# Patient Record
Sex: Female | Born: 1955 | Race: White | Hispanic: No | Marital: Married | Smoking: Never smoker
Health system: Southern US, Community
[De-identification: ages and names within clinical notes are randomized; demographics above are authoritative.]

## PROBLEM LIST (undated history)

## (undated) DIAGNOSIS — N189 Chronic kidney disease, unspecified: Secondary | ICD-10-CM

## (undated) DIAGNOSIS — N201 Calculus of ureter: Secondary | ICD-10-CM

## (undated) DIAGNOSIS — Z87442 Personal history of urinary calculi: Secondary | ICD-10-CM

## (undated) HISTORY — DX: Calculus of ureter: N20.1

---
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
## 1999-11-11 ENCOUNTER — Other Ambulatory Visit: Admission: RE | Admit: 1999-11-11 | Discharge: 1999-11-11 | Payer: Self-pay | Admitting: Obstetrics and Gynecology

## 2011-01-12 HISTORY — PX: COLONOSCOPY: SHX174

## 2011-06-01 ENCOUNTER — Encounter: Payer: Self-pay | Admitting: Internal Medicine

## 2011-06-01 LAB — HM MAMMOGRAPHY

## 2011-07-22 ENCOUNTER — Other Ambulatory Visit (HOSPITAL_COMMUNITY)
Admission: RE | Admit: 2011-07-22 | Discharge: 2011-07-22 | Disposition: A | Discharge status: Home / Self Care | Payer: BC Managed Care – PPO | Source: Ambulatory Visit | Attending: Family Medicine | Admitting: Family Medicine

## 2011-07-22 ENCOUNTER — Encounter: Payer: Self-pay | Admitting: Family Medicine

## 2011-07-22 ENCOUNTER — Ambulatory Visit (INDEPENDENT_AMBULATORY_CARE_PROVIDER_SITE_OTHER): Payer: BC Managed Care – PPO | Admitting: Family Medicine

## 2011-07-22 ENCOUNTER — Telehealth: Payer: Self-pay | Admitting: *Deleted

## 2011-07-22 VITALS — BP 124/78 | HR 72 | Ht 68.0 in | Wt 162.0 lb

## 2011-07-22 DIAGNOSIS — R5383 Other fatigue: Secondary | ICD-10-CM

## 2011-07-22 DIAGNOSIS — R5381 Other malaise: Secondary | ICD-10-CM

## 2011-07-22 DIAGNOSIS — Z01419 Encounter for gynecological examination (general) (routine) without abnormal findings: Secondary | ICD-10-CM | POA: Insufficient documentation

## 2011-07-22 DIAGNOSIS — Z0000 Encounter for general adult medical examination without abnormal findings: Secondary | ICD-10-CM

## 2011-07-22 DIAGNOSIS — Z13220 Encounter for screening for lipoid disorders: Secondary | ICD-10-CM

## 2011-07-22 DIAGNOSIS — Z1211 Encounter for screening for malignant neoplasm of colon: Secondary | ICD-10-CM

## 2011-07-22 DIAGNOSIS — Z23 Encounter for immunization: Secondary | ICD-10-CM

## 2011-07-22 LAB — COMPREHENSIVE METABOLIC PANEL
ALT: 12 U/L (ref 0–35)
AST: 16 U/L (ref 0–37)
Alkaline Phosphatase: 82 U/L (ref 39–117)
BUN: 15 mg/dL (ref 6–23)
Creat: 0.88 mg/dL (ref 0.50–1.10)
Total Bilirubin: 0.4 mg/dL (ref 0.3–1.2)

## 2011-07-22 LAB — CBC WITH DIFFERENTIAL/PLATELET
Basophils Absolute: 0 10*3/uL (ref 0.0–0.1)
Basophils Relative: 0 % (ref 0–1)
Eosinophils Relative: 0 % (ref 0–5)
HCT: 37.1 % (ref 36.0–46.0)
Hemoglobin: 12.5 g/dL (ref 12.0–15.0)
MCHC: 33.7 g/dL (ref 30.0–36.0)
MCV: 84.3 fL (ref 78.0–100.0)
Monocytes Absolute: 0.3 10*3/uL (ref 0.1–1.0)
Monocytes Relative: 4 % (ref 3–12)
Neutro Abs: 6.3 10*3/uL (ref 1.7–7.7)
RDW: 12.6 % (ref 11.5–15.5)

## 2011-07-22 LAB — POCT URINALYSIS DIPSTICK
Bilirubin, UA: NEGATIVE
Blood, UA: NEGATIVE
Glucose, UA: NEGATIVE
Ketones, UA: NEGATIVE
Spec Grav, UA: <=1.005
Urobilinogen, UA: NEGATIVE

## 2011-07-22 LAB — LIPID PANEL
Cholesterol: 223 mg/dL — ABNORMAL HIGH (ref 0–200)
HDL: 57 mg/dL (ref >39)
LDL Cholesterol: 151 mg/dL — ABNORMAL HIGH (ref 0–99)
Total CHOL/HDL Ratio: 3.9 Ratio
Triglycerides: 76 mg/dL (ref <150)
VLDL: 15 mg/dL (ref 0–40)

## 2011-07-22 NOTE — Telephone Encounter (Signed)
Called patient to give her consult info. She is scheduled at [facility] with their nurse for a pre-op visit 08/23/11 @ 9:15am-colonoscopy will be scheduled at this visit. Pt aware and verbalized understanding. Faxed records to [phone].

## 2011-07-22 NOTE — Progress Notes (Signed)
Tasha Lee is a 56 y.o. female who presents for a complete physical.  She has no particular concerns.  Health Maintenance: There is no immunization history on file for this patient. Believes last tetanus >10 years ago Hasn't gotten flu shots Last Pap smear: 3-4 years ago Last mammogram: 05/2011 Last colonoscopy: never Last DEXA: never Dentist: twice yearly Ophtho: every 2 years Exercise: walks 2-3 miles 3-4 times/week, plus a lot of yardwork  History reviewed. No pertinent past medical history.  History reviewed. No pertinent past surgical history.  History   Social History  . Marital Status: Married    Spouse Name: N/A    Number of Children: N/A  . Years of Education: N/A   Occupational History  . homemaker (former Runner, broadcasting/film/video at [employer])    Social History Main Topics  . Smoking status: Never Smoker   . Smokeless tobacco: Never Used  . Alcohol Use: No  . Drug Use: No  . Sexually Active: Yes -- Female partner(s)   Other Topics Concern  . Not on file   Social History Narrative   Lives with husband.  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  Former Runner, broadcasting/film/video at [employer] 1 daughter in [place], 1 daughter in [place]    Family History  Problem Relation Age of Onset  . Cancer Mother 15    breast  . Hypertension Mother   . Heart disease Father     first MI in 75's; CABG 71  . Dementia Father   . Diabetes Father   . Cancer Brother     prostate    Current outpatient prescriptions:acetaminophen (TYLENOL) 500 MG tablet, Take 500 mg by mouth as needed. Uses for HA's, but use is very rare., Disp: , Rfl:   No Known Allergies  ROS:  The patient denies anorexia, fever, weight changes, headaches,  vision changes, decreased hearing, ear pain, sore throat, breast concerns, chest pain, palpitations, dizziness, syncope, dyspnea on exertion, cough, swelling, nausea, vomiting, diarrhea, constipation, abdominal pain, melena, hematochezia, indigestion/heartburn, hematuria, incontinence,  dysuria, vaginal bleeding, discharge, odor or itch, genital lesions, joint pains, numbness, tingling, weakness, tremor, suspicious skin lesions, depression, anxiety, abnormal bleeding/bruising, or enlarged lymph nodes.  PHYSICAL EXAM: BP 124/78  Pulse 72  Ht 5\' 8"  (1.727 m)  Wt 162 lb (73.483 kg)  BMI 24.63 kg/m2  General Appearance:    Alert, cooperative, no distress, appears stated age  Head:    Normocephalic, without obvious abnormality, atraumatic  Eyes:    PERRL, conjunctiva/corneas clear, EOM's intact, fundi    benign  Ears:    Normal TM's and external ear canals  Nose:   Nares normal, mucosa normal, no drainage or sinus   tenderness  Throat:   Lips, mucosa, and tongue normal; teeth and gums normal  Neck:   Supple, no lymphadenopathy;  thyroid:  no   enlargement/tenderness/nodules; no carotid   bruit or JVD  Back:    Spine nontender, no curvature, ROM normal, no CVA     tenderness  Lungs:     Clear to auscultation bilaterally without wheezes, rales or     ronchi; respirations unlabored  Chest Wall:    No tenderness or deformity   Heart:    Regular rate and rhythm, S1 and S2 normal, no murmur, rub   or gallop  Breast Exam:    No tenderness, masses, or nipple discharge or inversion.      No axillary lymphadenopathy  Abdomen:     Soft, non-tender, nondistended, normoactive bowel sounds,  no masses, no hepatosplenomegaly  Genitalia:    Normal external genitalia without lesions.  BUS and vagina normal; cervix without lesions, or cervical motion tenderness. No abnormal vaginal discharge.  Uterus and adnexa not enlarged, nontender, no masses.  Pap performed  Rectal:    Normal tone, no masses or tenderness; guaiac negative stool  Extremities:   No clubbing, cyanosis or edema  Pulses:   2+ and symmetric all extremities  Skin:   Skin color, texture, turgor normal, no rashes or lesions. 0.5 cm mole in upper mid back, somewhat darker in central area.  Lymph nodes:   Cervical,  supraclavicular, and axillary nodes normal  Neurologic:   CNII-XII intact, normal strength, sensation and gait; reflexes 2+ and symmetric throughout          Psych:   Normal mood, affect, hygiene and grooming.     ASSESSMENT/PLAN:  1. Routine general medical examination at a health care facility  POCT Urinalysis Dipstick, Visual acuity screening, Lipid panel, Cytology - PAP  2. Need for Tdap vaccination  Tdap vaccine greater than or equal to 7yo IM  3. Other malaise and fatigue  Comprehensive metabolic panel, CBC with Differential, Vitamin D 25 hydroxy, TSH  4. Screening for lipoid disorders  Lipid panel  5. Screen for colon cancer  Ambulatory referral to Gastroenterology   Mole on back--slightly atypical.  Reviewed ABCD's of melanoma, to ask husband if any change.  To take picture and recheck in 3 and 6 months.  If any change, return for excision  Discussed monthly self breast exams and yearly mammograms after the age of 68; at least 30 minutes of aerobic activity at least 5 days/week; proper sunscreen use reviewed; healthy diet, including goals of calcium and vitamin D intake and alcohol recommendations (less than or equal to 1 drink/day) reviewed; regular seatbelt use; changing batteries in smoke detectors.  Immunization recommendations discussed--Tdap given today.  Yearly flu shots encouraged.  Colonoscopy recommendations reviewed--past due.  Discussed reasons for screening, and she is agreeable.  Refer to [facility] bc that's where her husband goes.

## 2011-07-22 NOTE — Patient Instructions (Signed)

## 2011-07-23 LAB — TSH: TSH: 1.636 u[IU]/mL (ref 0.350–4.500)

## 2011-07-27 ENCOUNTER — Encounter: Payer: Self-pay | Admitting: Family Medicine

## 2011-07-30 ENCOUNTER — Encounter: Payer: Self-pay | Admitting: Family Medicine

## 2011-07-30 ENCOUNTER — Other Ambulatory Visit (INDEPENDENT_AMBULATORY_CARE_PROVIDER_SITE_OTHER): Payer: BC Managed Care – PPO

## 2011-07-30 DIAGNOSIS — Z0000 Encounter for general adult medical examination without abnormal findings: Secondary | ICD-10-CM

## 2011-07-30 LAB — POC HEMOCCULT BLD/STL (HOME/3-CARD/SCREEN): Card #3 Fecal Occult Blood, POC: NEGATIVE

## 2011-10-04 ENCOUNTER — Other Ambulatory Visit: Payer: Self-pay | Admitting: Gastroenterology

## 2011-10-04 ENCOUNTER — Other Ambulatory Visit (HOSPITAL_COMMUNITY): Payer: Self-pay | Admitting: Gastroenterology

## 2011-10-04 ENCOUNTER — Ambulatory Visit (HOSPITAL_COMMUNITY)
Admission: RE | Admit: 2011-10-04 | Discharge: 2011-10-04 | Disposition: A | Discharge status: Home / Self Care | Payer: BC Managed Care – PPO | Source: Ambulatory Visit | Attending: Gastroenterology | Admitting: Gastroenterology

## 2011-10-04 DIAGNOSIS — R933 Abnormal findings on diagnostic imaging of other parts of digestive tract: Secondary | ICD-10-CM

## 2011-10-04 DIAGNOSIS — Q438 Other specified congenital malformations of intestine: Secondary | ICD-10-CM | POA: Insufficient documentation

## 2011-10-04 LAB — HM COLONOSCOPY

## 2011-10-08 ENCOUNTER — Encounter: Payer: Self-pay | Admitting: Family Medicine

## 2012-08-10 LAB — HM MAMMOGRAPHY: HM Mammogram: NEGATIVE

## 2012-08-11 ENCOUNTER — Encounter: Payer: Self-pay | Admitting: Internal Medicine

## 2013-08-13 LAB — HM MAMMOGRAPHY

## 2013-08-14 ENCOUNTER — Encounter: Payer: Self-pay | Admitting: Internal Medicine

## 2013-11-12 ENCOUNTER — Encounter: Payer: Self-pay | Admitting: Family Medicine

## 2014-08-16 LAB — HM MAMMOGRAPHY: HM Mammogram: NEGATIVE

## 2014-08-22 ENCOUNTER — Encounter: Payer: Self-pay | Admitting: *Deleted

## 2015-09-17 LAB — HM MAMMOGRAPHY

## 2015-09-24 ENCOUNTER — Encounter: Payer: Self-pay | Admitting: Family Medicine

## 2015-10-12 DIAGNOSIS — N189 Chronic kidney disease, unspecified: Secondary | ICD-10-CM

## 2015-10-12 DIAGNOSIS — N201 Calculus of ureter: Secondary | ICD-10-CM

## 2015-10-12 HISTORY — DX: Calculus of ureter: N20.1

## 2015-10-12 HISTORY — DX: Chronic kidney disease, unspecified: N18.9

## 2015-10-26 ENCOUNTER — Emergency Department (HOSPITAL_BASED_OUTPATIENT_CLINIC_OR_DEPARTMENT_OTHER): Payer: BLUE CROSS/BLUE SHIELD

## 2015-10-26 ENCOUNTER — Encounter: Payer: Self-pay | Admitting: Family Medicine

## 2015-10-26 ENCOUNTER — Encounter (HOSPITAL_BASED_OUTPATIENT_CLINIC_OR_DEPARTMENT_OTHER): Payer: Self-pay | Admitting: *Deleted

## 2015-10-26 ENCOUNTER — Emergency Department (HOSPITAL_BASED_OUTPATIENT_CLINIC_OR_DEPARTMENT_OTHER)
Admission: EM | Admit: 2015-10-26 | Discharge: 2015-10-26 | Disposition: A | Discharge status: Home / Self Care | Payer: BLUE CROSS/BLUE SHIELD | Attending: Emergency Medicine | Admitting: Emergency Medicine

## 2015-10-26 DIAGNOSIS — M549 Dorsalgia, unspecified: Secondary | ICD-10-CM | POA: Diagnosis present

## 2015-10-26 DIAGNOSIS — R109 Unspecified abdominal pain: Secondary | ICD-10-CM | POA: Diagnosis not present

## 2015-10-26 DIAGNOSIS — N201 Calculus of ureter: Secondary | ICD-10-CM | POA: Insufficient documentation

## 2015-10-26 DIAGNOSIS — N3001 Acute cystitis with hematuria: Secondary | ICD-10-CM | POA: Insufficient documentation

## 2015-10-26 DIAGNOSIS — N200 Calculus of kidney: Secondary | ICD-10-CM | POA: Insufficient documentation

## 2015-10-26 LAB — COMPREHENSIVE METABOLIC PANEL
ALT: 16 U/L (ref 14–54)
AST: 21 U/L (ref 15–41)
Albumin: 4.3 g/dL (ref 3.5–5.0)
Alkaline Phosphatase: 68 U/L (ref 38–126)
Anion gap: 10 (ref 5–15)
BUN: 23 mg/dL — ABNORMAL HIGH (ref 6–20)
CO2: 25 mmol/L (ref 22–32)
Calcium: 9.7 mg/dL (ref 8.9–10.3)
Chloride: 103 mmol/L (ref 101–111)
Creatinine, Ser: 0.99 mg/dL (ref 0.44–1.00)
GFR calc Af Amer: >60 mL/min (ref >60)
GFR, EST NON AFRICAN AMERICAN: 60 mL/min — AB (ref >60)
Glucose, Bld: 126 mg/dL — ABNORMAL HIGH (ref 65–99)
POTASSIUM: 3.8 mmol/L (ref 3.5–5.1)
SODIUM: 138 mmol/L (ref 135–145)
Total Bilirubin: 0.5 mg/dL (ref 0.3–1.2)
Total Protein: 7.9 g/dL (ref 6.5–8.1)

## 2015-10-26 LAB — URINALYSIS, ROUTINE W REFLEX MICROSCOPIC
Glucose, UA: NEGATIVE mg/dL
KETONES UR: 15 mg/dL — AB
Nitrite: NEGATIVE
PROTEIN: 30 mg/dL — AB
Specific Gravity, Urine: 1.027 (ref 1.005–1.030)
pH: 5.5 (ref 5.0–8.0)

## 2015-10-26 LAB — CBC WITH DIFFERENTIAL/PLATELET
BASOS ABS: 0 10*3/uL (ref 0.0–0.1)
BASOS PCT: 0 %
EOS ABS: 0.1 10*3/uL (ref 0.0–0.7)
EOS PCT: 1 %
HCT: 39.7 % (ref 36.0–46.0)
Hemoglobin: 13.7 g/dL (ref 12.0–15.0)
LYMPHS PCT: 11 %
Lymphs Abs: 1.3 10*3/uL (ref 0.7–4.0)
MCH: 30.1 pg (ref 26.0–34.0)
MCHC: 34.5 g/dL (ref 30.0–36.0)
MCV: 87.3 fL (ref 78.0–100.0)
Monocytes Absolute: 0.4 10*3/uL (ref 0.1–1.0)
Monocytes Relative: 4 %
Neutro Abs: 10.1 10*3/uL — ABNORMAL HIGH (ref 1.7–7.7)
Neutrophils Relative %: 84 %
PLATELETS: 229 10*3/uL (ref 150–400)
RBC: 4.55 MIL/uL (ref 3.87–5.11)
RDW: 12.3 % (ref 11.5–15.5)
WBC: 11.9 10*3/uL — AB (ref 4.0–10.5)

## 2015-10-26 LAB — URINE MICROSCOPIC-ADD ON

## 2015-10-26 MED ORDER — HYDROCODONE-ACETAMINOPHEN 5-325 MG PO TABS: 1.0000 | ORAL_TABLET | Freq: Four times a day (QID) | ORAL | 0 refills | Status: DC | PRN

## 2015-10-26 MED ORDER — ONDANSETRON 4 MG PO TBDP: 4.0000 mg | ORAL_TABLET | Freq: Three times a day (TID) | ORAL | 1 refills | Status: DC | PRN

## 2015-10-26 MED ORDER — HYDROMORPHONE HCL 1 MG/ML IJ SOLN
1.0000 mg | Freq: Once | INTRAMUSCULAR | Status: AC
  Administered 2015-10-26: 1 mg via INTRAVENOUS
  Filled 2015-10-26: qty 1

## 2015-10-26 MED ORDER — ONDANSETRON HCL 4 MG/2ML IJ SOLN
4.0000 mg | Freq: Once | INTRAMUSCULAR | Status: AC
  Administered 2015-10-26: 4 mg via INTRAVENOUS
  Filled 2015-10-26: qty 2

## 2015-10-26 MED ORDER — SODIUM CHLORIDE 0.9 % IV SOLN
INTRAVENOUS | Status: DC
  Administered 2015-10-26: 12:00:00 via INTRAVENOUS

## 2015-10-26 MED ORDER — SODIUM CHLORIDE 0.9 % IV BOLUS (SEPSIS)
500.0000 mL | Freq: Once | INTRAVENOUS | Status: AC
  Administered 2015-10-26: 500 mL via INTRAVENOUS

## 2015-10-26 MED ORDER — DEXTROSE 5 % IV SOLN
1.0000 g | Freq: Once | INTRAVENOUS | Status: AC
  Administered 2015-10-26: 1 g via INTRAVENOUS
  Filled 2015-10-26: qty 10

## 2015-10-26 MED ORDER — CEPHALEXIN 500 MG PO CAPS: 500.0000 mg | ORAL_CAPSULE | Freq: Four times a day (QID) | ORAL | 0 refills | Status: DC

## 2015-10-26 NOTE — ED Triage Notes (Signed)
Pt reports mid back pain radiating around her left side. Also reports vomiting. Denies hx of kidney stones. Reports that she was treated for UTI with cipro but only took 3 days of it.

## 2015-10-26 NOTE — Discharge Instructions (Addendum)
Take antibiotic Keflex as directed for the next 7 days. Urine culture sent. Make an appointment to follow-up with [facility] this week. Call on Monday. Take the hydrocodone as needed for pain. Supplement with the Zofran as needed for nausea and vomiting. Return for any new or worse symptoms.

## 2015-10-26 NOTE — ED Provider Notes (Signed)
[facility] Provider Note   CSN: [id number] Arrival date & time: 10/26/15  1100     History   Chief Complaint Chief Complaint  Patient presents with  . Back Pain    HPI Tasha Lee is a 60 y.o. female.  Patient with acute onset of left CVA left flank pain at 7:30 this morning. No prior history of kidney stones. Pain is intensified. Now moving over towards the right part of the back. Associated with nausea and vomiting. Pain is 10 out of 10. No gross blood in the urine. Patient treated with urinary tract infection a week ago 3 days worth of Cipro seemed to get better.      History reviewed. No pertinent past medical history.  There are no active problems to display for this patient.   History reviewed. No pertinent surgical history.  OB History    Gravida Para Term Preterm AB Living   1 1       2    SAB TAB Ectopic Multiple Live Births                   Home Medications    Prior to Admission medications   Medication Sig Start Date End Date Taking? Authorizing Provider  acetaminophen (TYLENOL) 500 MG tablet Take 500 mg by mouth as needed. Uses for HA's, but use is very rare.    Historical Provider, MD  cephALEXin (KEFLEX) 500 MG capsule Take 1 capsule (500 mg total) by mouth 4 (four) times daily. 10/26/15   Vanetta Mulders, MD  HYDROcodone-acetaminophen (NORCO/VICODIN) 5-325 MG tablet Take 1-2 tablets by mouth every 6 (six) hours as needed for moderate pain. 10/26/15   Vanetta Mulders, MD  ondansetron (ZOFRAN ODT) 4 MG disintegrating tablet Take 1 tablet (4 mg total) by mouth every 8 (eight) hours as needed for nausea or vomiting. 10/26/15   Vanetta Mulders, MD    Family History Family History  Problem Relation Age of Onset  . Cancer Mother 62    breast  . Hypertension Mother   . Heart disease Father     first MI in 79's; CABG 55  . Dementia Father   . Diabetes Father   . Cancer Brother     prostate    Social History Social History    Substance Use Topics  . Smoking status: Never Smoker  . Smokeless tobacco: Never Used  . Alcohol use No     Allergies   Review of patient's allergies indicates no known allergies.   Review of Systems Review of Systems  Constitutional: Negative for fatigue and fever.  HENT: Negative for congestion.   Eyes: Negative for visual disturbance.  Respiratory: Negative for shortness of breath.   Cardiovascular: Negative for chest pain.  Gastrointestinal: Positive for nausea and vomiting. Negative for abdominal pain.  Genitourinary: Positive for flank pain. Negative for dysuria and frequency.  Musculoskeletal: Positive for back pain.  Skin: Negative for rash.  Neurological: Negative for headaches.  Hematological: Does not bruise/bleed easily.  Psychiatric/Behavioral: Negative for confusion.     Physical Exam Updated Vital Signs BP 120/63 (BP Location: Left Arm)   Pulse (!) 54   Temp 97.8 F (36.6 C) (Oral)   Resp 18   Ht 5\' 10"  (1.778 m)   Wt 72.6 kg   SpO2 99%   BMI 22.96 kg/m   Physical Exam  Constitutional: She is oriented to person, place, and time. She appears well-developed and well-nourished. She appears distressed.  HENT:  Head: Normocephalic  and atraumatic.  Mouth/Throat: Oropharynx is clear and moist.  Eyes: EOM are normal. Pupils are equal, round, and reactive to light.  Neck: Normal range of motion. Neck supple.  Cardiovascular: Normal rate, regular rhythm and normal heart sounds.   Pulmonary/Chest: Effort normal and breath sounds normal. No respiratory distress.  Abdominal: Soft. Bowel sounds are normal. There is no tenderness.  Musculoskeletal: Normal range of motion. She exhibits no edema.  Neurological: She is alert and oriented to person, place, and time. No cranial nerve deficit. She exhibits normal muscle tone. Coordination normal.  Skin: Skin is warm.  Nursing note and vitals reviewed.    ED Treatments / Results  Labs (all labs ordered are  listed, but only abnormal results are displayed) Labs Reviewed  URINALYSIS, ROUTINE W REFLEX MICROSCOPIC (NOT AT [facility]) - Abnormal; Notable for the following:       Result Value   Color, Urine AMBER (*)    APPearance CLOUDY (*)    Hgb urine dipstick LARGE (*)    Bilirubin Urine SMALL (*)    Ketones, ur 15 (*)    Protein, ur 30 (*)    Leukocytes, UA SMALL (*)    All other components within normal limits  CBC WITH DIFFERENTIAL/PLATELET - Abnormal; Notable for the following:    WBC 11.9 (*)    Neutro Abs 10.1 (*)    All other components within normal limits  COMPREHENSIVE METABOLIC PANEL - Abnormal; Notable for the following:    Glucose, Bld 126 (*)    BUN 23 (*)    GFR calc non Af Amer 60 (*)    All other components within normal limits  URINE MICROSCOPIC-ADD ON - Abnormal; Notable for the following:    Squamous Epithelial / LPF 0-5 (*)    Bacteria, UA MANY (*)    All other components within normal limits  URINE CULTURE    EKG  EKG Interpretation None       Radiology Ct Renal Stone Study  Result Date: 10/26/2015 CLINICAL DATA:  Left flank pain.  Hematuria. EXAM: CT ABDOMEN AND PELVIS WITHOUT CONTRAST TECHNIQUE: Multidetector CT imaging of the abdomen and pelvis was performed following the standard protocol without IV contrast. COMPARISON:  None. FINDINGS: Lower chest:  Unremarkable. Hepatobiliary: No focal abnormality in the liver on this study without intravenous contrast. Layering sludge in the gallbladder lumen. No intrahepatic or extrahepatic biliary dilation. Pancreas: No focal mass lesion. No dilatation of the main duct. No intraparenchymal cyst. No peripancreatic edema. Spleen: No splenomegaly. No focal mass lesion. Adrenals/Urinary Tract: No adrenal nodule or mass. 8 mm water density lesion interpolar right kidney is compatible with a cyst. 4 mm stone identified lower pole left kidney. There is mild to moderate left hydronephrosis. 4 x 4 x 6 mm left UPJ stone noted. No  ureteral stones. No bladder stones. Stomach/Bowel: Stomach is nondistended. No gastric wall thickening. No evidence of outlet obstruction. Duodenum is normally positioned as is the ligament of Treitz. No small bowel wall thickening. No small bowel dilatation. The terminal ileum is normal. The appendix is normal. No gross colonic mass. No colonic wall thickening. No substantial diverticular change. Vascular/Lymphatic: No abdominal aortic aneurysm. No abdominal aortic atherosclerotic calcification. There is no gastrohepatic or hepatoduodenal ligament lymphadenopathy. No intraperitoneal or retroperitoneal lymphadenopathy. No pelvic sidewall lymphadenopathy. Reproductive: The uterus has normal CT imaging appearance. Left ovary unremarkable. 18 mm right ovarian cystic focus compatible with benign etiology. Other: No intraperitoneal free fluid. Musculoskeletal: Bone windows reveal no worrisome lytic  or sclerotic osseous lesions. IMPRESSION: 4 x 4 x 6 mm left UPJ stone causes mild to moderate left hydronephrosis. Left nephrolithiasis. Electronically Signed   By: Kennith CenterEric  Mansell M.D.   On: 10/26/2015 12:29    Procedures Procedures (including critical care time)  Medications Ordered in ED Medications  0.9 %  sodium chloride infusion ( Intravenous New Bag/Given 10/26/15 1221)  sodium chloride 0.9 % bolus 500 mL (0 mLs Intravenous Stopped 10/26/15 1314)  ondansetron (ZOFRAN) injection 4 mg (4 mg Intravenous Given 10/26/15 1220)  HYDROmorphone (DILAUDID) injection 1 mg (1 mg Intravenous Given 10/26/15 1220)  cefTRIAXone (ROCEPHIN) 1 g in dextrose 5 % 50 mL IVPB (1 g Intravenous New Bag/Given 10/26/15 1311)     Initial Impression / Assessment and Plan / ED Course  I have reviewed the triage vital signs and the nursing notes.  Pertinent labs & imaging results that were available during my care of the patient were reviewed by me and considered in my medical decision making (see chart for details).  Clinical  Course    Patient with acute onset of left back pain left flank pain. Started radiate over to the right side. Onset of the pain was at 7:30 this morning. Got significantly worse. Associated with nausea and vomiting. Patient treated for urinary tract infection a week ago to 3 day course of Cipro.  Workup here today consistent with ureteral stone. 4 x 6 mm stone on the left side high up. Patient will follow-up with [facility]. Will treat with pain medication and antinausea medicine. Urinalysis also with some fair amount of bacteria so culture sent and will restart antibiotics this time we'll go with Keflex patient received 1 dose of Rocephin here.   Final Clinical Impressions(s) / ED Diagnoses   Final diagnoses:  Acute cystitis with hematuria  Left ureteral stone    New Prescriptions New Prescriptions   CEPHALEXIN (KEFLEX) 500 MG CAPSULE    Take 1 capsule (500 mg total) by mouth 4 (four) times daily.   HYDROCODONE-ACETAMINOPHEN (NORCO/VICODIN) 5-325 MG TABLET    Take 1-2 tablets by mouth every 6 (six) hours as needed for moderate pain.   ONDANSETRON (ZOFRAN ODT) 4 MG DISINTEGRATING TABLET    Take 1 tablet (4 mg total) by mouth every 8 (eight) hours as needed for nausea or vomiting.     Vanetta MuldersScott Trini Soldo, MD 10/26/15 1408

## 2015-10-26 NOTE — ED Notes (Signed)
Pt woke this morning about 0730 with pain in left flank and nausea.  Pt vomited x5-6 this morning, last occurrence approx 90 minutes ago.

## 2015-10-26 NOTE — ED Notes (Signed)
Pt states she took 500mg  tylenol at approx 0900 today but vomited very shortly thereafter.

## 2015-10-28 LAB — URINE CULTURE: Culture: <10000 — AB

## 2015-11-07 ENCOUNTER — Other Ambulatory Visit: Payer: Self-pay | Admitting: Urology

## 2015-11-10 ENCOUNTER — Encounter (HOSPITAL_COMMUNITY): Payer: Self-pay | Admitting: *Deleted

## 2015-11-10 DIAGNOSIS — N201 Calculus of ureter: Secondary | ICD-10-CM | POA: Diagnosis not present

## 2015-11-12 HISTORY — PX: LITHOTRIPSY: SUR834

## 2015-11-12 NOTE — H&P (Signed)
Office Visit Report     11/06/2015   --------------------------------------------------------------------------------   Tasha Lee  MRN: [id number]  PRIMARY CARE:  Lavonda JumboEve A. Knapp, MD  DOB: 05-08-1955, 60 year old Female  REFERRING:  Vanetta MuldersScott Zackowski  SSN: [id number]  PROVIDER:  Jerilee FieldMatthew Eskridge, M.D.    TREATING:  Anne FuLarry Gibson    LOCATION:  [facility] [phone]   --------------------------------------------------------------------------------   CC/HPI: Two-week office visit follow-up for a left ureteral calculi.    She has not had any bothersome symptoms in approximately 3 days but before that flank pain and lower back pain were severe and intermittent. Denies bothersome lower urinary tract symptoms. She cannot tolerate Tamsulosin due to hypotension. Denies fevers.     ALLERGIES: No Known Drug Allergies    MEDICATIONS: None   GU PSH: None   NON-GU PSH: None   GU PMH: Calculus Ureter - 10/30/2015    NON-GU PMH: None   FAMILY HISTORY: 2 daughters - Other Coronary Artery Disease - Mother, Father Dementia - Father Kidney Stones - Father   SOCIAL HISTORY: Marital Status: Married Current Smoking Status: Patient has never smoked.  Does not drink caffeine. 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    REVIEW OF SYSTEMS:    GU Review Female:   Patient denies frequent urination, hard to postpone urination, burning /pain with urination, get up at night to urinate, leakage of urine, stream starts and stops, trouble starting your stream, have to strain to urinate, and currently pregnant.  Gastrointestinal (Upper):   Patient denies nausea, vomiting, and indigestion/ heartburn.  Gastrointestinal (Lower):   Patient denies diarrhea and constipation.  Constitutional:   Patient denies fatigue, weight loss, fever, and night sweats.  Skin:   Patient denies skin rash/ lesion and itching.  Eyes:   Patient denies blurred vision and double  vision.  Ears/ Nose/ Throat:   Patient denies sore throat and sinus problems.  Hematologic/Lymphatic:   Patient denies swollen glands and easy bruising.  Cardiovascular:   Patient denies leg swelling and chest pains.  Respiratory:   Patient denies cough and shortness of breath.  Endocrine:   Patient denies excessive thirst.  Musculoskeletal:   Patient denies back pain and joint pain.  Neurological:   Patient denies headaches and dizziness.  Psychologic:   Patient denies depression and anxiety.   VITAL SIGNS:      11/06/2015 08:24 AM  Weight 155 lb / 70.31 kg  Height 70 in / 177.8 cm  BP 137/86 mmHg  Pulse 71 /min  Temperature 98.2 F / 37 C  BMI 22.2 kg/m   GU PHYSICAL EXAMINATION:    Bladder: Normal to palpation, no tenderness, no mass, normal size.    MULTI-SYSTEM PHYSICAL EXAMINATION:    Constitutional: Well-nourished. No physical deformities. Normally developed. Good grooming.  Neck: Neck symmetrical, not swollen. Normal tracheal position.  Respiratory: No labored breathing, no use of accessory muscles.   Cardiovascular: Normal temperature, normal extremity pulses, no swelling, no varicosities.  Lymphatic: No enlargement of neck, axillae, groin.  Skin: No paleness, no jaundice, no cyanosis. No lesion, no ulcer, no rash.  Neurologic / Psychiatric: Oriented to time, oriented to place, oriented to person. No depression, no anxiety, no agitation.  Gastrointestinal: No hernia. No mass, no tenderness, no rigidity, non obese abdomen.   Eyes: Normal conjunctivae. Normal eyelids.  Ears, Nose, Mouth, and Throat: Left ear no scars, no lesions, no masses. Right ear no scars, no lesions, no masses. Nose no  scars, no lesions, no masses. Normal hearing. Normal lips.  Musculoskeletal: Spine, ribs, pelvis no bilateral tenderness. Normal gait and station of head and neck.     PAST DATA REVIEWED:  Source Of History:  Patient  Records Review:   Previous Patient Records  Urine Test Review:    Urinalysis  X-Ray Review: KUB: Reviewed Films.     PROCEDURES:         KUB - 74000  A single view of the abdomen is obtained.      A calculus is noted over the transverse process of the left L3 lumbar vertebrae. No other ureteral calculi are seen. Bladder is free of obstruction. Normal appearing bowel gas pattern.         Urinalysis Dipstick Dipstick Cont'd  Color: Yellow Bilirubin: Neg  Appearance: Clear Ketones: Neg  Specific Gravity: 1.025 Blood: Neg  pH: <=5.0 Protein: Neg  Glucose: Neg Urobilinogen: 0.2    Nitrites: Neg    Leukocyte Esterase: Neg    ASSESSMENT:      ICD-10 Details  1 GU:   Calculus Ureter - N20.1    PLAN:            Medications Stop Meds: Keflex 500 mg capsule  Discontinue: 11/06/2015  - Reason: The medication cycle was completed.  Tamsulosin Hcl 0.4 mg capsule, ext release 24 hr 1 capsule PO Q HS  Start: 10/30/2015  Stop: 12/29/2015   Discontinue: 11/06/2015  - Reason: The medication cycle was completed.            Orders Labs Urine Culture and Sensitivity          Schedule Return Visit: Next Available Appointment - Schedule Surgery          Document Letter(s):  Created for Patient: Clinical Summary         Notes:   Will discuss setting her up for possible ESWL. Risks and benefits described to her. She has a grandchild due in 10 days as well a parent in hospice so she is hoping to expedite. I answered all questions. Understanding expressed. F/u instructions for worsening symptoms given. We will contact her with next plan of care.    * Signed by Anne FuLarry Gibson on 11/06/15 at 8:52 AM (EDT)*

## 2015-11-13 ENCOUNTER — Ambulatory Visit (HOSPITAL_COMMUNITY): Payer: BLUE CROSS/BLUE SHIELD

## 2015-11-13 ENCOUNTER — Encounter (HOSPITAL_COMMUNITY)
Admission: RE | Disposition: A | Discharge status: Home / Self Care | Payer: Self-pay | Source: Ambulatory Visit | Attending: Urology

## 2015-11-13 ENCOUNTER — Encounter (HOSPITAL_COMMUNITY): Payer: Self-pay | Admitting: General Practice

## 2015-11-13 ENCOUNTER — Ambulatory Visit (HOSPITAL_COMMUNITY)
Admission: RE | Admit: 2015-11-13 | Discharge: 2015-11-13 | Disposition: A | Discharge status: Home / Self Care | Payer: BLUE CROSS/BLUE SHIELD | Source: Ambulatory Visit | Attending: Urology | Admitting: Urology

## 2015-11-13 DIAGNOSIS — N201 Calculus of ureter: Secondary | ICD-10-CM

## 2015-11-13 HISTORY — DX: Chronic kidney disease, unspecified: N18.9

## 2015-11-13 SURGERY — LITHOTRIPSY, ESWL
Anesthesia: LOCAL | Laterality: Left

## 2015-11-13 MED ORDER — SODIUM CHLORIDE 0.9 % IV SOLN
INTRAVENOUS | Status: DC
  Administered 2015-11-13: 07:00:00 via INTRAVENOUS

## 2015-11-13 MED ORDER — DIPHENHYDRAMINE HCL 25 MG PO CAPS
25.0000 mg | ORAL_CAPSULE | ORAL | Status: AC
  Administered 2015-11-13: 25 mg via ORAL
  Filled 2015-11-13: qty 1

## 2015-11-13 MED ORDER — CIPROFLOXACIN HCL 500 MG PO TABS
500.0000 mg | ORAL_TABLET | ORAL | Status: AC
  Administered 2015-11-13: 500 mg via ORAL
  Filled 2015-11-13: qty 1

## 2015-11-13 MED ORDER — DIAZEPAM 5 MG PO TABS
10.0000 mg | ORAL_TABLET | ORAL | Status: AC
  Administered 2015-11-13: 10 mg via ORAL
  Filled 2015-11-13: qty 2

## 2015-11-13 NOTE — Interval H&P Note (Signed)
History and Physical Interval Note:  11/13/2015 7:38 AM  Tasha Lee  has presented today for surgery, with the diagnosis of LEFT URETERAL STONE  The various methods of treatment have been discussed with the patient and family. After consideration of risks, benefits and other options for treatment, the patient has consented to  Procedure(s): LEFT EXTRACORPOREAL SHOCK WAVE LITHOTRIPSY (ESWL) (Left) as a surgical intervention .  The patient's history has been reviewed, patient examined, no change in status, stable for surgery.  I have reviewed the patient's chart and labs.  Questions were answered to the patient's satisfaction.     Kemper Heupel,LES

## 2015-11-13 NOTE — Discharge Instructions (Signed)
1. You should strain your urine and collect all fragments and bring them to your follow up appointment.  °2. You should take your pain medication as needed.  Please call if your pain is severe to the point that it is not controlled with your pain medication. °3. You should call if you develop fever > 101 or persistent nausea or vomiting. °4. Your doctor may prescribe tamsulosin to take to help facilitate stone passage. °

## 2015-11-13 NOTE — Op Note (Signed)
Please see scanned chart for ESWL operative note. 

## 2016-03-18 DIAGNOSIS — N200 Calculus of kidney: Secondary | ICD-10-CM | POA: Diagnosis not present

## 2016-04-25 ENCOUNTER — Encounter: Payer: Self-pay | Admitting: Family Medicine

## 2016-04-25 NOTE — Progress Notes (Signed)
Chief Complaint  Patient presents with  . Annual Exam    fasting annual with pap. Will have eyes checked at opthomologist. No concerns.     Tasha Lee is a 61 y.o. female who presents for a complete physical.  She was last seen in this office in 2013.  Her brother was diagnosed with leukemia 2 months ago.  She reports he is currently "in remission", but extremely weak. 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  Her mother passed away in 12/11/22.  She is still helping take care of her in-laws, which is sometimes very frustrating.  L ureteral stone in 10/2015. Seen by Dr. Junious Silk at [facility] to f/u ER visit. She underwent lithotripsy with Dr. Alinda Money in 11-Dec-2022. Stone eventually passed (didn't catch it, but reports it showed up on x-rays/calcified).   Immunization History  Administered Date(s) Administered  . Tdap 07/22/2011   Flu shot--never had one Last Pap smear: 07/2011, normal (HPV not tested) Last mammogram: 09/2015 Last colonoscopy: 09/2011 normal; very tortuous colon, needed to complete with Barium Enema; Dr. Penelope Coop at [place]. Told 10 year follow-up Last DEXA: never Dentist: twice yearly Ophtho: yearly, plans to switch doctors Exercise: walks 35 minutes,  2 times/week, plus a lot of yardwork (mows 2 yards/week, with push-mower). Cares for grandson (20#), in-laws, cleaning out mother's home. Lipids: Lab Results  Component Value Date   CHOL 223 (H) 07/22/2011   HDL 57 07/22/2011   LDLCALC 151 (H) 07/22/2011   TRIG 76 07/22/2011   CHOLHDL 3.9 07/22/2011   Vitamin D-OH 47 in 07/2011  Past Medical History:  Diagnosis Date  . Arthritis   . Chronic kidney disease 10/2015  . Left ureteral stone 10/2015    Past Surgical History:  Procedure Laterality Date  . COLONOSCOPY  2013   Tortuous colon, finished with barium enema  . LITHOTRIPSY Left 12-11-2015    Social History   Social History  . Marital status: Married    Spouse name: N/A  .  Number of children: N/A  . Years of education: N/A   Occupational History  . homemaker (former Pharmacist, hospital at [employer])    Social History Main Topics  . Smoking status: Never Smoker  . Smokeless tobacco: Never Used  . Alcohol use No  . Drug use: No  . Sexual activity: Yes    Partners: Male   Other Topics Concern  . Not on file   Social History Narrative   Lives with husband.  Caregiver for mother (passed Dec 11, 2015) and in-laws.  Former Pharmacist, hospital at [employer]. 2 daughters in [place], 1 grandson.   Husband with retinal detachments, h/o [employer] amnesia (related to stress)    Family History  Problem Relation Age of Onset  . Cancer Mother 95    breast  . Hypertension Mother   . Dementia Mother   . Kidney failure Mother   . Heart disease Mother     CHF  . Heart disease Father     first MI in 17's; CABG 28  . Dementia Father   . Diabetes Father   . Cancer Brother     prostate (57s), NonHodgkin's lymphome (60), leukemia (94)  . Leukemia Brother   . Lymphoma Brother   . Prostate cancer Brother     Outpatient Encounter Prescriptions as of 04/26/2016  Medication Sig  . acetaminophen (TYLENOL) 500 MG tablet Take 500 mg by mouth as needed. Uses for HA's, but use is very rare.  . [  DISCONTINUED] HYDROcodone-acetaminophen (NORCO/VICODIN) 5-325 MG tablet Take 1-2 tablets by mouth every 6 (six) hours as needed for moderate pain.  . [DISCONTINUED] ondansetron (ZOFRAN ODT) 4 MG disintegrating tablet Take 1 tablet (4 mg total) by mouth every 8 (eight) hours as needed for nausea or vomiting.   No facility-administered encounter medications on file as of 04/26/2016.     No Known Allergies  ROS:  The patient denies anorexia, fever, weight changes, headaches,  vision changes, decreased hearing, ear pain, sore throat, breast concerns, chest pain, palpitations, dizziness, syncope, dyspnea on exertion, cough, swelling, nausea, vomiting, diarrhea, constipation, abdominal pain, melena, hematochezia,  indigestion/heartburn, hematuria, incontinence, dysuria, vaginal bleeding, discharge, odor or itch, genital lesions, joint pains, numbness, tingling, weakness, tremor, suspicious skin lesions, depression, anxiety, abnormal bleeding/bruising, or enlarged lymph nodes. Rare hot flash. Many stressors, but moods okay overall.    PHYSICAL EXAM:  BP 140/90 (BP Location: Right Arm, Patient Position: Sitting, Cuff Size: Normal)   Pulse 72   Ht 5' 8"  (1.727 m)   Wt 157 lb 12.8 oz (71.6 kg)   BMI 23.99 kg/m   132/72 on repeat by MD (still stressed, talking about her mother-in-law)  Wt Readings from Last 3 Encounters:  11/13/15 152 lb 12.8 oz (69.3 kg)  10/26/15 160 lb (72.6 kg)  07/22/11 162 lb (73.5 kg)    General Appearance:    Alert, cooperative, no distress, appears stated age  Head:    Normocephalic, without obvious abnormality, atraumatic  Eyes:    PERRL, conjunctiva/corneas clear, EOM's intact, fundi benign  Ears:    Normal TM's and external ear canals  Nose:   Nares normal, mucosa normal, no drainage or sinus tenderness  Throat:   Lips, mucosa, and tongue normal; teeth and gums normal  Neck:   Supple, no lymphadenopathy;  thyroid:  no   enlargement/tenderness/nodules; no carotid bruit or JVD  Back:    Spine nontender, no curvature, ROM normal, no CVA tenderness  Lungs:     Clear to auscultation bilaterally without wheezes, rales or ronchi; respirations unlabored  Chest Wall:    No tenderness or deformity   Heart:    Regular rate and rhythm, S1 and S2 normal, no murmur, rub or gallop  Breast Exam:    No tenderness, masses, or nipple discharge or inversion.  No axillary lymphadenopathy  Abdomen:     Soft, non-tender, nondistended, normoactive bowel sounds,    no masses, no hepatosplenomegaly  Genitalia:    Normal external genitalia without lesions; atrophic changes noted.  BUS and vagina normal; cervix without lesions, or cervical motion tenderness. No abnormal vaginal discharge.   Uterus and adnexa not enlarged, nontender, no masses.  Pap performed  Rectal:    Normal tone, no masses or tenderness; guaiac negative stool. External hemoorhoid, nontender  Extremities:   No clubbing, cyanosis or edema  Pulses:   2+ and symmetric all extremities  Skin:   Skin color, texture, turgor normal, no rashes or lesions. 0.5 cm mole in upper mid back, somewhat darker in central area, which is raised (49m width, 433mheight; Central darker/raised area is 2 x 3 mm); Many angioma on abdomen.  Lymph nodes:   Cervical, supraclavicular, and axillary nodes normal  Neurologic:   CNII-XII intact, normal strength, sensation and gait; reflexes 2+ and symmetric throughout                                  Psych:  Normal mood, affect, hygiene and grooming.     Chemistry      Component Value Date/Time   NA 138 10/26/2015 1127   K 3.8 10/26/2015 1127   CL 103 10/26/2015 1127   CO2 25 10/26/2015 1127   BUN 23 (H) 10/26/2015 1127   CREATININE 0.99 10/26/2015 1127   CREATININE 0.88 07/22/2011 1136      Component Value Date/Time   CALCIUM 9.7 10/26/2015 1127   ALKPHOS 68 10/26/2015 1127   AST 21 10/26/2015 1127   ALT 16 10/26/2015 1127   BILITOT 0.5 10/26/2015 1127     Glucose 126 (nonfasting)  Lab Results  Component Value Date   WBC 11.9 (H) 10/26/2015   HGB 13.7 10/26/2015   HCT 39.7 10/26/2015   MCV 87.3 10/26/2015   PLT 229 10/26/2015     ASSESSMENT/PLAN:  Annual physical exam - Plan: POCT Urinalysis Dipstick, Lipid panel, Comprehensive metabolic panel, CBC with Differential/Platelet, TSH, Hepatitis C antibody, Cytology - PAP [place]  Hypercholesterolemia - Plan: Lipid panel  Other fatigue - Plan: Comprehensive metabolic panel, CBC with Differential/Platelet, TSH, Hepatitis C antibody  Immunization due - Plan: Varicella-zoster vaccine IM (Shingrix)   Pap performed. shingrix given today (husband had his recently and was covered, same insurance).  Repeat in 2 months  ([place]). Fasting c-met, Lipids, CBC, TSH, Hep C Declines HIV  Has Living Will, Healthcare POA.  Asked for copy.  Discussed monthly self breast exams and yearly mammograms; at least 30 minutes of aerobic activity at least 5 days/week; proper sunscreen use reviewed; healthy diet, including goals of calcium and vitamin D intake and alcohol recommendations (less than or equal to 1 drink/day) reviewed; regular seatbelt use; changing batteries in smoke detectors.  Immunization recommendations discussed. Yearly flu shots encouraged.  Shingrix given. Colonoscopy recommendations reviewed--UTD.  DEXA age 24, 31 age 80.   f/u 1 year, sooner prn.

## 2016-04-26 ENCOUNTER — Ambulatory Visit (INDEPENDENT_AMBULATORY_CARE_PROVIDER_SITE_OTHER): Payer: 59 | Admitting: Family Medicine

## 2016-04-26 ENCOUNTER — Other Ambulatory Visit (HOSPITAL_COMMUNITY)
Admission: RE | Admit: 2016-04-26 | Discharge: 2016-04-26 | Disposition: A | Discharge status: Home / Self Care | Payer: 59 | Source: Ambulatory Visit | Attending: Family Medicine | Admitting: Family Medicine

## 2016-04-26 ENCOUNTER — Encounter: Payer: Self-pay | Admitting: Family Medicine

## 2016-04-26 VITALS — BP 132/72 | HR 72 | Ht 68.0 in | Wt 157.8 lb

## 2016-04-26 DIAGNOSIS — R5383 Other fatigue: Secondary | ICD-10-CM

## 2016-04-26 DIAGNOSIS — Z0000 Encounter for general adult medical examination without abnormal findings: Secondary | ICD-10-CM | POA: Insufficient documentation

## 2016-04-26 DIAGNOSIS — E7800 Pure hypercholesterolemia, unspecified: Secondary | ICD-10-CM | POA: Diagnosis not present

## 2016-04-26 DIAGNOSIS — Z23 Encounter for immunization: Secondary | ICD-10-CM

## 2016-04-26 LAB — COMPREHENSIVE METABOLIC PANEL
ALT: 40 U/L — ABNORMAL HIGH (ref 6–29)
AST: 31 U/L (ref 10–35)
Albumin: 4.5 g/dL (ref 3.6–5.1)
Alkaline Phosphatase: 65 U/L (ref 33–130)
BUN: 18 mg/dL (ref 7–25)
CALCIUM: 9.8 mg/dL (ref 8.6–10.4)
CHLORIDE: 101 mmol/L (ref 98–110)
CO2: 25 mmol/L (ref 20–31)
Creat: 0.74 mg/dL (ref 0.50–0.99)
GLUCOSE: 90 mg/dL (ref 65–99)
Potassium: 4.1 mmol/L (ref 3.5–5.3)
Sodium: 137 mmol/L (ref 135–146)
Total Bilirubin: 0.5 mg/dL (ref 0.2–1.2)
Total Protein: 7.7 g/dL (ref 6.1–8.1)

## 2016-04-26 LAB — POCT URINALYSIS DIPSTICK
Bilirubin, UA: NEGATIVE
Blood, UA: NEGATIVE
Glucose, UA: NEGATIVE
Ketones, UA: NEGATIVE
LEUKOCYTES UA: NEGATIVE
Nitrite, UA: NEGATIVE
Protein, UA: NEGATIVE
SPEC GRAV UA: 1.01 (ref 1.010–1.025)
UROBILINOGEN UA: NEGATIVE U/dL — AB
pH, UA: 6 (ref 5.0–8.0)

## 2016-04-26 LAB — CBC WITH DIFFERENTIAL/PLATELET
BASOS PCT: 0 %
Basophils Absolute: 0 cells/uL (ref 0–200)
EOS ABS: 110 {cells}/uL (ref 15–500)
Eosinophils Relative: 2 %
HEMATOCRIT: 39.1 % (ref 35.0–45.0)
HEMOGLOBIN: 13.1 g/dL (ref 11.7–15.5)
LYMPHS ABS: 1320 {cells}/uL (ref 850–3900)
Lymphocytes Relative: 24 %
MCH: 29 pg (ref 27.0–33.0)
MCHC: 33.5 g/dL (ref 32.0–36.0)
MCV: 86.5 fL (ref 80.0–100.0)
MONO ABS: 330 {cells}/uL (ref 200–950)
MPV: 10.3 fL (ref 7.5–12.5)
Monocytes Relative: 6 %
NEUTROS PCT: 68 %
Neutro Abs: 3740 cells/uL (ref 1500–7800)
Platelets: 234 10*3/uL (ref 140–400)
RBC: 4.52 MIL/uL (ref 3.80–5.10)
RDW: 13.3 % (ref 11.0–15.0)
WBC: 5.5 10*3/uL (ref 4.0–10.5)

## 2016-04-26 LAB — LIPID PANEL
CHOL/HDL RATIO: 3.4 ratio (ref <5.0)
CHOLESTEROL: 238 mg/dL — AB (ref <200)
HDL: 71 mg/dL (ref >50)
LDL Cholesterol: 151 mg/dL — ABNORMAL HIGH (ref <100)
Triglycerides: 79 mg/dL (ref <150)
VLDL: 16 mg/dL (ref <30)

## 2016-04-26 LAB — TSH: TSH: 1.86 mIU/L

## 2016-04-26 NOTE — Patient Instructions (Signed)

## 2016-04-27 LAB — CYTOLOGY - PAP
Diagnosis: NEGATIVE
HPV (WINDOPATH): NOT DETECTED

## 2016-04-27 LAB — HEPATITIS C ANTIBODY: HCV AB: NEGATIVE

## 2016-05-21 DIAGNOSIS — Z52001 Unspecified donor, stem cells: Secondary | ICD-10-CM | POA: Diagnosis not present

## 2016-06-28 ENCOUNTER — Other Ambulatory Visit (INDEPENDENT_AMBULATORY_CARE_PROVIDER_SITE_OTHER): Payer: 59

## 2016-06-28 DIAGNOSIS — Z23 Encounter for immunization: Secondary | ICD-10-CM | POA: Diagnosis not present

## 2016-09-28 DIAGNOSIS — Z803 Family history of malignant neoplasm of breast: Secondary | ICD-10-CM | POA: Diagnosis not present

## 2016-09-28 DIAGNOSIS — Z1231 Encounter for screening mammogram for malignant neoplasm of breast: Secondary | ICD-10-CM | POA: Diagnosis not present

## 2016-09-28 LAB — HM MAMMOGRAPHY

## 2016-09-30 ENCOUNTER — Encounter: Payer: Self-pay | Admitting: *Deleted

## 2016-10-01 ENCOUNTER — Encounter: Payer: Self-pay | Admitting: Family Medicine

## 2017-02-27 DIAGNOSIS — N3001 Acute cystitis with hematuria: Secondary | ICD-10-CM | POA: Diagnosis not present

## 2017-03-21 DIAGNOSIS — N200 Calculus of kidney: Secondary | ICD-10-CM | POA: Diagnosis not present

## 2017-04-26 NOTE — Progress Notes (Signed)
Chief Complaint  Patient presents with  . Annual Exam    fasting annual with pelvic. Will schedule with [facility]. No concerns.     Tasha Lee is a 62 y.o. female who presents for a complete physical.  She has no concerns.  She has had a lot going on this year: Brother had stem cell transplant for AML (patient donated stem cells), ultimately didn't work (only for a short time), passed away. Daughter just had a baby (3 weeks ago), needs her gallbladder out. Husband needs hip replacement (Dr. Alvan Dame), PSA crept up over 4 (awaiting eval); still having to help out with in-laws in [place].   She denies problems--doesn't feel overwhelmed (did last year, when trying to sell mom's house and doing stem cells), just a little tired.  H/o L ureteral stone in 10/2015, under care of [facility]. She has another stone still, seen on last x-ray 6 weeks ago, stable. She sees urologist yearly. Drinks lemon-water as directed.  Hyperlipidemia: Cholesterol has been borderline, with good HDL.  She tries to follow lowfat, low cholesterol diet.  Lab Results  Component Value Date   CHOL 238 (H) 04/26/2016   HDL 71 04/26/2016   LDLCALC 151 (H) 04/26/2016   TRIG 79 04/26/2016   CHOLHDL 3.4 04/26/2016   Has a living will and healthcare power of attorney.  Immunization History  Administered Date(s) Administered  . Tdap 07/22/2011  . Zoster Recombinat (Shingrix) 04/26/2016, 06/28/2016   Flu shot--never had one. Actually thought about it this past year, but was too busy. Last Pap smear: 04/2016, normal (atrophy), no high risk HPV present Last mammogram: 09/2016 Last colonoscopy: 09/2011 normal; very tortuous colon, needed to complete with Barium Enema; Dr. Penelope Coop at [facility]. Told 10 year follow-up Last DEXA: never Dentist: twice yearly Ophtho: yearly, overdue Exercise: walks 35 minutes,  2 times/week, plus a lot of yardwork (mows 2 yards/week, with push-mower) and babysitting. Carries and chases  35# toddler grandson.  Vitamin D-OH 47 in 07/2011  Past Medical History:  Diagnosis Date  . Arthritis   . Chronic kidney disease 10/2015  . Left ureteral stone 10/2015    Past Surgical History:  Procedure Laterality Date  . COLONOSCOPY  2013   Tortuous colon, finished with barium enema  . LITHOTRIPSY Left 11/2015    Social History   Socioeconomic History  . Marital status: Married    Spouse name: Not on file  . Number of children: Not on file  . Years of education: Not on file  . Highest education level: Not on file  Occupational History  . Occupation: homemaker (former Pharmacist, hospital at [employer])  Social Needs  . Financial resource strain: Not on file  . Food insecurity:    Worry: Not on file    Inability: Not on file  . Transportation needs:    Medical: Not on file    Non-medical: Not on file  Tobacco Use  . Smoking status: Never Smoker  . Smokeless tobacco: Never Used  Substance and Sexual Activity  . Alcohol use: No  . Drug use: No  . Sexual activity: Yes    Partners: Male  Lifestyle  . Physical activity:    Days per week: Not on file    Minutes per session: Not on file  . Stress: Not on file  Relationships  . Social connections:    Talks on phone: Not on file    Gets together: Not on file    Attends religious service: Not on  file    Active member of club or organization: Not on file    Attends meetings of clubs or organizations: Not on file    Relationship status: Not on file  . Intimate partner violence:    Fear of current or ex partner: Not on file    Emotionally abused: Not on file    Physically abused: Not on file    Forced sexual activity: Not on file  Other Topics Concern  . Not on file  Social History Narrative   Lives with husband.  Caregiver for in-laws (who live in [place], has in-home help, but they go frequently).  Former Pharmacist, hospital at [employer]. 2 daughters in [place], 1 grandson, 1 granddaughter   Husband with retinal detachments, h/o  [employer] amnesia (related to stress), hip arthritis    Family History  Problem Relation Age of Onset  . Cancer Mother 27       breast  . Hypertension Mother   . Dementia Mother   . Kidney failure Mother   . Heart disease Mother        CHF  . Heart disease Father        first MI in 76's; CABG 32  . Dementia Father   . Diabetes Father   . Cancer Brother        prostate (33s), NonHodgkin's lymphome (60), leukemia (40)  . Leukemia Brother   . Lymphoma Brother   . Prostate cancer Brother     Outpatient Encounter Medications as of 04/28/2017  Medication Sig  . acetaminophen (TYLENOL) 500 MG tablet Take 500 mg by mouth as needed. Uses for HA's, but use is very rare.   No facility-administered encounter medications on file as of 04/28/2017.     No Known Allergies  ROS: The patient denies anorexia, fever, weight changes, headaches, vision changes, decreased hearing, ear pain, sore throat, breast concerns, chest pain, palpitations, dizziness, syncope, dyspnea on exertion, cough, swelling, nausea, vomiting, diarrhea, constipation, abdominal pain, melena, hematochezia, indigestion/heartburn, hematuria, incontinence, dysuria, vaginal bleeding, discharge, odor or itch, genital lesions, joint pains, numbness, tingling, weakness, tremor, suspicious skin lesions, depression, anxiety, abnormal bleeding/bruising, or enlarged lymph nodes.  +stress, causing some fatigue but overall moods are good.    PHYSICAL EXAM:  BP 128/76   Pulse 72   Ht 5' 8.5" (1.74 m)   Wt 159 lb (72.1 kg)   BMI 23.82 kg/m   Wt Readings from Last 3 Encounters:  04/28/17 159 lb (72.1 kg)  04/26/16 157 lb 12.8 oz (71.6 kg)  11/13/15 152 lb 12.8 oz (69.3 kg)    General Appearance:  Alert, cooperative, no distress, appears stated age  Head:  Normocephalic, without obvious abnormality, atraumatic  Eyes:  PERRL, conjunctiva/corneas clear, EOM's intact, fundi benign  Ears:  Normal TM's and external ear  canals  Nose: Nares normal, mucosa normal, no drainage or sinus tenderness  Throat: Lips, mucosa, and tongue normal; teeth and gums normal  Neck: Supple, no lymphadenopathy; thyroid: no enlargement/tenderness/ nodules; no carotid bruit or JVD  Back:  Spine nontender, no curvature, ROM normal, no CVAtenderness  Lungs:  Clear to auscultation bilaterally without wheezes, rales orronchi; respirations unlabored  Chest Wall:  No tenderness or deformity  Heart:  Regular rate and rhythm, S1 and S2 normal, no murmur, rub or gallop  Breast Exam:  No tenderness, masses, or nipple discharge or inversion. No axillary lymphadenopathy  Abdomen:  Soft, non-tender, nondistended, normoactive bowel sounds,  no masses, no hepatosplenomegaly  Genitalia:  Normal external genitalia  without lesions; atrophic changes noted. BUS and vagina normal. No cervical motion tenderness. No abnormal vaginal discharge. Uterus and adnexa not enlarged, nontender, no masses. Pap not performed  Rectal:  Normal tone, no masses or tenderness; guaiac negative stool.  External hemorrhoid, nontender  Extremities: No clubbing, cyanosis or edema  Pulses: 2+ and symmetric all extremities  Skin: Skin color, texture, turgor normal, no rashes or lesions. 0.5 cm mole in upper mid back, somewhat darker in central area, unchanged. Many angioma on abdomen.  Lymph nodes: Cervical, supraclavicular, and axillary nodes normal  Neurologic: CNII-XII intact, normal strength, sensation and gait; reflexes 2+ and symmetric throughout      Psych: Normal mood, affect, hygiene and grooming.   ASSESSMENT/PLAN:  Annual physical exam - Plan: Comprehensive metabolic panel, Lipid panel, CBC with Differential/Platelet, POCT Urinalysis DIP (Proadvantage Device)  Hypercholesterolemia - Plan: Comprehensive metabolic panel, Lipid panel  Immunization due - Plan: Tdap vaccine greater than or  equal to 7yo IM   TdaP booster today-->5 yrs, going on Medicare next year (when it won't be covered), and taking care of infant currently.   c-met (one LFT elevated last year), lipid Recheck CBC--last documented 05/2016 at [facility] abnormal, due to stem cell donations. No thyroid symptoms   Discussed monthly self breast exams and yearly mammograms; at least 30 minutes of aerobic activity at least 5 days/week, weight-bearing exercise at least 2x/wk; proper sunscreen use reviewed; healthy diet, including goals of calcium and vitamin D intake and alcohol recommendations (less than or equal to 1 drink/day) reviewed; regular seatbelt use; changing batteries in smoke detectors. Immunization recommendations discussed.TdaP given today. Yearly flu shots encouraged. [id number] next year (age 24), followed by pneumovax. Colonoscopy recommendations reviewed--UTD.  Consider Cologard when next due (discussed).  DEXA age 18  f/u 1 year, sooner prn.

## 2017-04-28 ENCOUNTER — Encounter: Payer: Self-pay | Admitting: Family Medicine

## 2017-04-28 ENCOUNTER — Ambulatory Visit: Payer: 59 | Admitting: Family Medicine

## 2017-04-28 VITALS — BP 128/76 | HR 72 | Ht 68.5 in | Wt 159.0 lb

## 2017-04-28 DIAGNOSIS — Z0000 Encounter for general adult medical examination without abnormal findings: Secondary | ICD-10-CM | POA: Diagnosis not present

## 2017-04-28 DIAGNOSIS — Z23 Encounter for immunization: Secondary | ICD-10-CM | POA: Diagnosis not present

## 2017-04-28 DIAGNOSIS — E7800 Pure hypercholesterolemia, unspecified: Secondary | ICD-10-CM | POA: Diagnosis not present

## 2017-04-28 LAB — POCT URINALYSIS DIP (PROADVANTAGE DEVICE)
BILIRUBIN UA: NEGATIVE
BILIRUBIN UA: NEGATIVE mg/dL
Blood, UA: NEGATIVE
GLUCOSE UA: NEGATIVE mg/dL
Leukocytes, UA: NEGATIVE
Nitrite, UA: NEGATIVE
Protein Ur, POC: NEGATIVE mg/dL
SPECIFIC GRAVITY, URINE: 1.005
Urobilinogen, Ur: NEGATIVE
pH, UA: 6 (ref 5.0–8.0)

## 2017-04-28 NOTE — Patient Instructions (Addendum)
  HEALTH MAINTENANCE RECOMMENDATIONS:  It is recommended that you get at least 30 minutes of aerobic exercise at least 5 days/week (for weight loss, you may need as much as 60-90 minutes). This can be any activity that gets your heart rate up. This can be divided in 10-15 minute intervals if needed, but try and build up your endurance at least once a week.  Weight bearing exercise is also recommended twice weekly.  Eat a healthy diet with lots of vegetables, fruits and fiber.  "Colorful" foods have a lot of vitamins (ie green vegetables, tomatoes, red peppers, etc).  Limit sweet tea, regular sodas and alcoholic beverages, all of which has a lot of calories and sugar.  Up to 1 alcoholic drink daily may be beneficial for women (unless trying to lose weight, watch sugars).  Drink a lot of water.  Calcium recommendations are 1200-1500 mg daily (1500 mg for postmenopausal women or women without ovaries), and vitamin D 1000 IU daily.  This should be obtained from diet and/or supplements (vitamins), and calcium should not be taken all at once, but in divided doses.  Monthly self breast exams and yearly mammograms for women over the age of 64 is recommended.  Sunscreen of at least SPF 30 should be used on all sun-exposed parts of the skin when outside between the hours of 10 am and 4 pm (not just when at beach or pool, but even with exercise, golf, tennis, and yard work!)  Use a sunscreen that says "broad spectrum" so it covers both UVA and UVB rays, and make sure to reapply every 1-2 hours.  Remember to change the batteries in your smoke detectors when changing your clock times in the spring and fall.   Use your seat belt every time you are in a car, and please drive safely and not be distracted with cell phones and texting while driving.  Please get flu shots yearly, preferably in September/October. You can get this anywhere, just let us know the date.  We gave you a TdaP booster today (your daughter  will be happy to see that you got the pertussis booster).  I encourage you to take a women's multivitamin daily (ie centrum silver for women).

## 2017-04-29 LAB — CBC WITH DIFFERENTIAL/PLATELET
BASOS ABS: 0 10*3/uL (ref 0.0–0.2)
Basos: 1 %
EOS (ABSOLUTE): 0.1 10*3/uL (ref 0.0–0.4)
EOS: 2 %
HEMATOCRIT: 38.5 % (ref 34.0–46.6)
Hemoglobin: 13.2 g/dL (ref 11.1–15.9)
Immature Grans (Abs): 0 10*3/uL (ref 0.0–0.1)
Immature Granulocytes: 0 %
LYMPHS ABS: 1 10*3/uL (ref 0.7–3.1)
Lymphs: 18 %
MCH: 29.7 pg (ref 26.6–33.0)
MCHC: 34.3 g/dL (ref 31.5–35.7)
MCV: 87 fL (ref 79–97)
MONOCYTES: 5 %
Monocytes Absolute: 0.2 10*3/uL (ref 0.1–0.9)
NEUTROS ABS: 4 10*3/uL (ref 1.4–7.0)
Neutrophils: 74 %
Platelets: 233 10*3/uL (ref 150–379)
RBC: 4.44 x10E6/uL (ref 3.77–5.28)
RDW: 13.5 % (ref 12.3–15.4)
WBC: 5.4 10*3/uL (ref 3.4–10.8)

## 2017-04-29 LAB — COMPREHENSIVE METABOLIC PANEL
ALT: 19 IU/L (ref 0–32)
AST: 22 IU/L (ref 0–40)
Albumin/Globulin Ratio: 1.7 (ref 1.2–2.2)
Albumin: 4.5 g/dL (ref 3.6–4.8)
Alkaline Phosphatase: 78 IU/L (ref 39–117)
BILIRUBIN TOTAL: 0.4 mg/dL (ref 0.0–1.2)
BUN/Creatinine Ratio: 21 (ref 12–28)
BUN: 18 mg/dL (ref 8–27)
CALCIUM: 9.4 mg/dL (ref 8.7–10.3)
CHLORIDE: 101 mmol/L (ref 96–106)
CO2: 21 mmol/L (ref 20–29)
CREATININE: 0.87 mg/dL (ref 0.57–1.00)
GFR calc Af Amer: 81 mL/min/{1.73_m2} (ref >59)
GFR calc non Af Amer: 71 mL/min/{1.73_m2} (ref >59)
GLUCOSE: 86 mg/dL (ref 65–99)
Globulin, Total: 2.6 g/dL (ref 1.5–4.5)
Potassium: 4.1 mmol/L (ref 3.5–5.2)
Sodium: 139 mmol/L (ref 134–144)
Total Protein: 7.1 g/dL (ref 6.0–8.5)

## 2017-04-29 LAB — LIPID PANEL
Chol/HDL Ratio: 3.2 ratio (ref 0.0–4.4)
Cholesterol, Total: 225 mg/dL — ABNORMAL HIGH (ref 100–199)
HDL: 71 mg/dL (ref >39)
LDL CALC: 143 mg/dL — AB (ref 0–99)
Triglycerides: 53 mg/dL (ref 0–149)
VLDL CHOLESTEROL CAL: 11 mg/dL (ref 5–40)

## 2017-09-29 DIAGNOSIS — Z1231 Encounter for screening mammogram for malignant neoplasm of breast: Secondary | ICD-10-CM | POA: Diagnosis not present

## 2017-09-29 DIAGNOSIS — Z803 Family history of malignant neoplasm of breast: Secondary | ICD-10-CM | POA: Diagnosis not present

## 2017-09-29 LAB — HM MAMMOGRAPHY

## 2017-11-02 ENCOUNTER — Encounter: Payer: Self-pay | Admitting: Family Medicine

## 2017-11-02 DIAGNOSIS — Z23 Encounter for immunization: Secondary | ICD-10-CM | POA: Diagnosis not present

## 2017-12-20 ENCOUNTER — Encounter: Payer: Self-pay | Admitting: Family Medicine

## 2018-01-13 DIAGNOSIS — R3129 Other microscopic hematuria: Secondary | ICD-10-CM | POA: Diagnosis not present

## 2018-01-13 DIAGNOSIS — N200 Calculus of kidney: Secondary | ICD-10-CM | POA: Diagnosis not present

## 2018-01-16 ENCOUNTER — Other Ambulatory Visit: Payer: Self-pay | Admitting: Urology

## 2018-01-16 ENCOUNTER — Encounter (HOSPITAL_COMMUNITY): Payer: Self-pay | Admitting: General Practice

## 2018-01-18 NOTE — H&P (Signed)
Urology Preoperative H&P   Chief Complaint: Left flank pain  History of Present Illness: Tasha Lee is a 63 y.o. female with seen by Dr. Kathrynn Running for left sided flank pain and renal colic.  She denies interval febrile episodes.  Voiding w/o difficulty.  She had a CTSS on 01/13/18 that demostrated an 8 mm left UPJ stone with mild hydronephrosis.  She is here today for left ESWL   Past Medical History:  Diagnosis Date  . Chronic kidney disease 10/2015  . History of kidney stones   . Left ureteral stone 10/2015    Past Surgical History:  Procedure Laterality Date  . COLONOSCOPY  2013   Tortuous colon, finished with barium enema  . LITHOTRIPSY Left 11/2015    Allergies: No Known Allergies  Family History  Problem Relation Age of Onset  . Cancer Mother 26       breast  . Hypertension Mother   . Dementia Mother   . Kidney failure Mother   . Heart disease Mother        CHF  . Heart disease Father        first MI in 25's; CABG 37  . Dementia Father   . Diabetes Father   . Cancer Brother        prostate (50s), NonHodgkin's lymphome (60), leukemia (70)  . Leukemia Brother   . Lymphoma Brother   . Prostate cancer Brother     Social History:  reports that she has never smoked. She has never used smokeless tobacco. She reports that she does not drink alcohol or use drugs.  ROS: A complete review of systems was performed.  All systems are negative except for pertinent findings as noted.  Physical Exam:  Vital signs in last 24 hours: BP: ()/()  Arterial Line BP: ()/()  Constitutional:  Alert and oriented, No acute distress Cardiovascular: Regular rate and rhythm, No JVD Respiratory: Normal respiratory effort, Lungs clear bilaterally GI: Abdomen is soft, nontender, nondistended, no abdominal masses GU: No CVA tenderness Lymphatic: No lymphadenopathy Neurologic: Grossly intact, no focal deficits Psychiatric: Normal mood and affect  Laboratory Data:  No results for  input(s): WBC, HGB, HCT, PLT in the last 72 hours.  No results for input(s): NA, K, CL, GLUCOSE, BUN, CALCIUM, CREATININE in the last 72 hours.  Invalid input(s): CO3   No results found for this or any previous visit (from the past 24 hour(s)). No results found for this or any previous visit (from the past 240 hour(s)).  Renal Function: No results for input(s): CREATININE in the last 168 hours. CrCl cannot be calculated (Patient's most recent lab result is older than the maximum 21 days allowed.).  Radiologic Imaging: No results found.  I independently reviewed the above imaging studies.  Assessment and Plan [name] is a 63 y.o. female with an 8 mm left UPJ stone  The risks, benefits and alternatives of LEFT ESWL was discussed with the patient. I described the risks which include arrhythmia, kidney contusion, kidney hemorrhage, need for transfusion, back discomfort, flank ecchymosis, flank abrasion, inability to fracture the stone, inability to pass stone fragments, Steinstrasse, infection associated with obstructing stones, need for an alternative surgical procedure and possible need for repeat shockwave lithotripsy.  The patient voices understanding and wishes to proceed.         Rhoderick Moody, MD 01/18/2018, 4:11 PM  [facility] Pager: [phone]

## 2018-01-19 ENCOUNTER — Encounter (HOSPITAL_COMMUNITY)
Admission: RE | Disposition: A | Discharge status: Home / Self Care | Payer: Self-pay | Source: Home / Self Care | Attending: Urology

## 2018-01-19 ENCOUNTER — Ambulatory Visit (HOSPITAL_COMMUNITY)
Admission: RE | Admit: 2018-01-19 | Discharge: 2018-01-19 | Disposition: A | Discharge status: Home / Self Care | Payer: 59 | Attending: Urology | Admitting: Urology

## 2018-01-19 ENCOUNTER — Ambulatory Visit (HOSPITAL_COMMUNITY): Payer: 59

## 2018-01-19 ENCOUNTER — Encounter (HOSPITAL_COMMUNITY): Payer: Self-pay | Admitting: *Deleted

## 2018-01-19 ENCOUNTER — Other Ambulatory Visit: Payer: Self-pay

## 2018-01-19 DIAGNOSIS — N189 Chronic kidney disease, unspecified: Secondary | ICD-10-CM | POA: Diagnosis not present

## 2018-01-19 DIAGNOSIS — N201 Calculus of ureter: Secondary | ICD-10-CM | POA: Diagnosis not present

## 2018-01-19 DIAGNOSIS — N132 Hydronephrosis with renal and ureteral calculous obstruction: Secondary | ICD-10-CM | POA: Insufficient documentation

## 2018-01-19 DIAGNOSIS — Z87442 Personal history of urinary calculi: Secondary | ICD-10-CM | POA: Insufficient documentation

## 2018-01-19 HISTORY — DX: Personal history of urinary calculi: Z87.442

## 2018-01-19 HISTORY — PX: EXTRACORPOREAL SHOCK WAVE LITHOTRIPSY: SHX1557

## 2018-01-19 SURGERY — LITHOTRIPSY, ESWL
Anesthesia: LOCAL | Laterality: Left

## 2018-01-19 MED ORDER — CIPROFLOXACIN HCL 500 MG PO TABS
500.0000 mg | ORAL_TABLET | ORAL | Status: AC
  Administered 2018-01-19: 500 mg via ORAL
  Filled 2018-01-19: qty 1

## 2018-01-19 MED ORDER — ONDANSETRON HCL 4 MG PO TABS: 4.0000 mg | ORAL_TABLET | Freq: Three times a day (TID) | ORAL | 0 refills | Status: DC | PRN

## 2018-01-19 MED ORDER — DIAZEPAM 5 MG PO TABS
10.0000 mg | ORAL_TABLET | ORAL | Status: AC
  Administered 2018-01-19: 10 mg via ORAL
  Filled 2018-01-19: qty 2

## 2018-01-19 MED ORDER — DIPHENHYDRAMINE HCL 25 MG PO CAPS
25.0000 mg | ORAL_CAPSULE | ORAL | Status: AC
  Administered 2018-01-19: 25 mg via ORAL
  Filled 2018-01-19: qty 1

## 2018-01-19 MED ORDER — SODIUM CHLORIDE 0.9 % IV SOLN
INTRAVENOUS | Status: DC
  Administered 2018-01-19: 07:00:00 via INTRAVENOUS

## 2018-01-19 NOTE — Op Note (Signed)
[facility] Operative Note  Treating Physician: Paizlee Kinder, MD  Pre-op diagnosis: 7 mm left UPJ stone  Post-op diagnosis: Same   Procedure: Left ESWL  See [facility] OP note scanned into chart. Also because of the size, density, location and other factors that cannot be anticipated I feel this will likely be a staged procedure. This fact supersedes any indication in the scanned [facility] operative note to the contrary    

## 2018-01-19 NOTE — Discharge Instructions (Signed)
Lithotripsy, Care After °This sheet gives you information about how to care for yourself after your procedure. Your health care provider may also give you more specific instructions. If you have problems or questions, contact your health care provider. °What can I expect after the procedure? °After the procedure, it is common to have: °· Some blood in your urine. This should only last for a few days. °· Soreness in your back, sides, or upper abdomen for a few days. °· Blotches or bruises on your back where the pressure wave entered the skin. °· Pain, discomfort, or nausea when pieces (fragments) of the kidney stone move through the tube that carries urine from the kidney to the bladder (ureter). Stone fragments may pass soon after the procedure, but they may continue to pass for up to 4-8 weeks. °? If you have severe pain or nausea, contact your health care provider. This may be caused by a large stone that was not broken up, and this may mean that you need more treatment. °· Some pain or discomfort during urination. °· Some pain or discomfort in the lower abdomen or (in men) at the base of the penis. °Follow these instructions at home: °Medicines °· Take over-the-counter and prescription medicines only as told by your health care provider. °· If you were prescribed an antibiotic medicine, take it as told by your health care provider. Do not stop taking the antibiotic even if you start to feel better. °· Do not drive for 24 hours if you were given a medicine to help you relax (sedative). °· Do not drive or use heavy machinery while taking prescription pain medicine. °Eating and drinking ° °  ° °· Drink enough water and fluids to keep your urine clear or pale yellow. This helps any remaining pieces of the stone to pass. It can also help prevent new stones from forming. °· Eat plenty of fresh fruits and vegetables. °· Follow instructions from your health care provider about eating and drinking restrictions. You may be  instructed: °? To reduce how much salt (sodium) you eat or drink. Check ingredients and nutrition facts on packaged foods and beverages. °? To reduce how much meat you eat. °· Eat the recommended amount of calcium for your age and gender. Ask your health care provider how much calcium you should have. °General instructions °· Get plenty of rest. °· Most people can resume normal activities 1-2 days after the procedure. Ask your health care provider what activities are safe for you. °· Your health care provider may direct you to lie in a certain position (postural drainage) and tap firmly (percuss) over your kidney area to help stone fragments pass. Follow instructions as told by your health care provider. °· If directed, strain all urine through the strainer that was provided by your health care provider. °? Keep all fragments for your health care provider to see. Any stones that are found may be sent to a medical lab for examination. The stone may be as small as a grain of salt. °· Keep all follow-up visits as told by your health care provider. This is important. °Contact a health care provider if: °· You have pain that is severe or does not get better with medicine. °· You have nausea that is severe or does not go away. °· You have blood in your urine longer than your health care provider told you to expect. °· You have more blood in your urine. °· You have pain during urination that does   not go away. °· You urinate more frequently than usual and this does not go away. °· You develop a rash or any other possible signs of an allergic reaction. °Get help right away if: °· You have severe pain in your back, sides, or upper abdomen. °· You have severe pain while urinating. °· Your urine is very dark red. °· You have blood in your stool (feces). °· You cannot pass any urine at all. °· You feel a strong urge to urinate after emptying your bladder. °· You have a fever or chills. °· You develop shortness of breath,  difficulty breathing, or chest pain. °· You have severe nausea that leads to persistent vomiting. °· You faint. °Summary °· After this procedure, it is common to have some pain, discomfort, or nausea when pieces (fragments) of the kidney stone move through the tube that carries urine from the kidney to the bladder (ureter). If this pain or nausea is severe, however, you should contact your health care provider. °· Most people can resume normal activities 1-2 days after the procedure. Ask your health care provider what activities are safe for you. °· Drink enough water and fluids to keep your urine clear or pale yellow. This helps any remaining pieces of the stone to pass, and it can help prevent new stones from forming. °· If directed, strain your urine and keep all fragments for your health care provider to see. Fragments or stones may be as small as a grain of salt. °· Get help right away if you have severe pain in your back, sides, or upper abdomen or have severe pain while urinating. °This information is not intended to replace advice given to you by your health care provider. Make sure you discuss any questions you have with your health care provider. °Document Released: 01/17/2007 Document Revised: 06/08/2017 Document Reviewed: 11/19/2015 °Elsevier Interactive Patient Education © 2019 [employer] ° ° °Moderate Conscious Sedation, Adult, Care After °These instructions provide you with information about caring for yourself after your procedure. Your health care provider may also give you more specific instructions. Your treatment has been planned according to current medical practices, but problems sometimes occur. Call your health care provider if you have any problems or questions after your procedure. °What can I expect after the procedure? °After your procedure, it is common: °· To feel sleepy for several hours. °· To feel clumsy and have poor balance for several hours. °· To have poor judgment for several  hours. °· To vomit if you eat too soon. °Follow these instructions at home: °For at least 24 hours after the procedure: ° °· Do not: °? Participate in activities where you could fall or become injured. °? Drive. °? Use heavy machinery. °? Drink alcohol. °? Take sleeping pills or medicines that cause drowsiness. °? Make important decisions or sign legal documents. °? Take care of children on your own. °· Rest. °Eating and drinking °· Follow the diet recommended by your health care provider. °· If you vomit: °? Drink water, juice, or soup when you can drink without vomiting. °? Make sure you have little or no nausea before eating solid foods. °General instructions °· Have a responsible adult stay with you until you are awake and alert. °· Take over-the-counter and prescription medicines only as told by your health care provider. °· If you smoke, do not smoke without supervision. °· Keep all follow-up visits as told by your health care provider. This is important. °Contact a health care provider   if: °· You keep feeling nauseous or you keep vomiting. °· You feel light-headed. °· You develop a rash. °· You have a fever. °Get help right away if: °· You have trouble breathing. °This information is not intended to replace advice given to you by your health care provider. Make sure you discuss any questions you have with your health care provider. °Document Released: 10/18/2012 Document Revised: 06/02/2015 Document Reviewed: 04/19/2015 °Elsevier Interactive Patient Education © 2019 [employer] ° ° °

## 2018-01-20 ENCOUNTER — Encounter (HOSPITAL_COMMUNITY): Payer: Self-pay | Admitting: Urology

## 2018-02-09 DIAGNOSIS — N201 Calculus of ureter: Secondary | ICD-10-CM | POA: Diagnosis not present

## 2018-02-21 DIAGNOSIS — N201 Calculus of ureter: Secondary | ICD-10-CM | POA: Diagnosis not present

## 2018-03-07 DIAGNOSIS — N201 Calculus of ureter: Secondary | ICD-10-CM | POA: Diagnosis not present

## 2018-03-08 ENCOUNTER — Other Ambulatory Visit: Payer: Self-pay | Admitting: Urology

## 2018-03-13 ENCOUNTER — Encounter (HOSPITAL_COMMUNITY): Payer: Self-pay | Admitting: General Practice

## 2018-03-14 NOTE — H&P (Signed)
1 - Recurrent Nephrolithiasis -  Pre 2020: SWLx1, MET x 1  01/2018 - Left 54m prox ureterla stone with mod hydro on eval nausea and coliklky left flank pain. Stone is solitary, 760m 720HU, SSD 11cm and at L2-3 interspace area. UA without sig infectious parameters, no fevers.   2 - Medical Stone Disease -  Eval 2020: BMP, PTH, Urate - pending; Composition - pending; 24 Hr Urines - pending   PMH unremarkable. NO ischemic CV disease / blood thinners. NO prior non-gu surgereis. Her PCP is EvRita OharaD>   02/09/2018: Today "Tasha Lee seen for follow up from recent left ESWL on 1/9. Today she states that she has passed multiple sand like fragments since her procedure. She denies any current flank or abdominal pain. She denies exacerbation of voiding symptoms, gross hematuria, fevers, chills, nausea, or vomiting.   02/21/2018: She returns for repeat OV and KUB. At last OV, Stone treated with ESWL appeared unchanged from ESWL on 01/09.   She reports no interval stone material passage. Not having any left-sided pain/discomfort. Denies bothersome lower urinary tract symptoms. She has not seen gross hematuria, increased frequency/urgency, painful voiding or dysuria. Denies fevers/chills, nausea/vomiting.    03/07/2018: Began on Flomax at last OV for MET. Returns today for re-evaluation. Stone remains visible along the course of the left proximal ureter, a little more distal than last imaging study.  Today she is doing well. No interval stone material passage. Not having any left-sided pain/discomfort. Denies bothersome increasing lower urinary tract symptoms. No dysuria, painful urgency, gross hematuria. Denies fevers or chills, nausea/vomiting. She has had some fluctuating constipation and diarrhea since beginning tamsulosin which always occurs when she takes the medication. Reports trying to increase her fiber and water intake.     ALLERGIES: No Known Drug Allergies    MEDICATIONS: Tamsulosin Hcl 0.4  mg capsule 1 capsule PO Daily     GU PSH: ESWL, Left - 01/19/2018, 11/13/2015    NON-GU PSH: None   GU PMH: Renal calculus - 03/21/2017, - 03/18/2016 Ureteral calculus - 2018, - 10/30/2015    NON-GU PMH: None   FAMILY HISTORY: 12 daughters - Other Coronary Artery Disease - Mother, Father Dementia - Father Kidney Stones - Father   SOCIAL HISTORY: Marital Status: Married Preferred Language: English; Ethnicity: Not Hispanic Or Latino; Race: White Current Smoking Status: Patient has never smoked.  Does not use smokeless tobacco. Has never drank.  Does not use drugs. Does not drink caffeine. Has not had a blood transfusion. 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    REVIEW OF SYSTEMS:    GU Review Female:   Patient denies frequent urination, hard to postpone urination, burning /pain with urination, get up at night to urinate, leakage of urine, stream starts and stops, trouble starting your stream, have to strain to urinate, and being pregnant.  Gastrointestinal (Upper):   Patient denies nausea, vomiting, and indigestion/ heartburn.  Gastrointestinal (Lower):   Patient reports diarrhea and constipation.   Constitutional:   Patient denies fever, night sweats, weight loss, and fatigue.  Skin:   Patient denies skin rash/ lesion and itching.  Eyes:   Patient denies blurred vision and double vision.  Ears/ Nose/ Throat:   Patient denies sore throat and sinus problems.  Hematologic/Lymphatic:   Patient denies swollen glands and easy bruising.  Cardiovascular:   Patient denies leg swelling and chest pains.  Respiratory:   Patient denies cough and shortness of breath.  Endocrine:   Patient  denies excessive thirst.  Musculoskeletal:   Patient denies back pain and joint pain.  Neurological:   Patient denies headaches and dizziness.  Psychologic:   Patient denies depression and anxiety.   VITAL SIGNS:      03/07/2018 09:03 AM  BP 147/81 mmHg  Pulse 77 /min   Temperature 98.1 F / 36.7 C   MULTI-SYSTEM PHYSICAL EXAMINATION:    Constitutional: Well-nourished. No physical deformities. Normally developed. Good grooming.  Neck: Neck symmetrical, not swollen. Normal tracheal position.  Respiratory: No labored breathing, no use of accessory muscles.   Cardiovascular: Normal temperature, normal extremity pulses, no swelling, no varicosities.  Neurologic / Psychiatric: Oriented to time, oriented to place, oriented to person. No depression, no anxiety, no agitation.  Gastrointestinal: No mass, no tenderness, no rigidity, non obese abdomen. No CVAT.   Musculoskeletal: Normal gait and station of head and neck.     PAST DATA REVIEWED:  Source Of History:  Patient  Records Review:   Previous Patient Records  Urine Test Review:   Urinalysis, Urine Culture  X-Ray Review: KUB: Reviewed Films. Discussed With Patient.     03/07/18  Urinalysis  Urine Appearance Clear   Urine Color Yellow   Urine Glucose Neg mg/dL  Urine Bilirubin Neg mg/dL  Urine Ketones Neg mg/dL  Urine Specific Gravity 1.015   Urine Blood Neg ery/uL  Urine pH <=5.0   Urine Protein Neg mg/dL  Urine Urobilinogen 0.2 mg/dL  Urine Nitrites Neg   Urine Leukocyte Esterase Trace leu/uL  Urine WBC/hpf 0 - 5/hpf   Urine RBC/hpf 0 - 2/hpf   Urine Epithelial Cells 0 - 5/hpf   Urine Bacteria Rare (0-9/hpf)   Urine Mucous Not Present   Urine Yeast NS (Not Seen)   Urine Trichomonas Not Present   Urine Cystals NS (Not Seen)   Urine Casts NS (Not Seen)   Urine Sperm Not Present    PROCEDURES:         KUB - 52080  A single view of the abdomen is obtained. No obvious opacities noted within the confines of the right renal shadow or along the expected anatomical course of the right ureter. No obvious opacities noted within he confines of the left renal shadow. Along the expected anatomical course of the left proximal ureter, the previously identified ureteral calculus has transitioned distally  now at junstion of L3/L4. However, there is prominent overlying bowel gas in this area today. Multiple stable appearing pelvic phleboliths. No other ureteral calculi observed today.      Patient confirmed No Neulasta OnPro Device.           Urinalysis w/Scope Dipstick Dipstick Cont'd Micro  Color: Yellow Bilirubin: Neg mg/dL WBC/hpf: 0 - 5/hpf  Appearance: Clear Ketones: Neg mg/dL RBC/hpf: 0 - 2/hpf  Specific Gravity: 1.015 Blood: Neg ery/uL Bacteria: Rare (0-9/hpf)  pH: <=5.0 Protein: Neg mg/dL Cystals: NS (Not Seen)  Glucose: Neg mg/dL Urobilinogen: 0.2 mg/dL Casts: NS (Not Seen)    Nitrites: Neg Trichomonas: Not Present    Leukocyte Esterase: Trace leu/uL Mucous: Not Present      Epithelial Cells: 0 - 5/hpf      Yeast: NS (Not Seen)      Sperm: Not Present    ASSESSMENT:      ICD-10 Details  1 GU:   Ureteral calculus - N20.1    PLAN:           Orders Labs Urine Culture          Schedule  Return Visit/Planned Activity: 1 Week - Schedule Surgery          Document Letter(s):  Created for Patient: Clinical Summary         Notes:   6 weeks post lithotripsy with only minimal ureteral stone movement. Patient remains grossly asymptomatic. Discussed definitive intervention again with her. Her preference would be for lithotripsy but I also mentioned ureteroscopy as well. I'll send a message to her urologist and have the patient contacted to reschedule. Risks, benefits, peri-treatment course discussed.  She'll continue tamsulosin. Recommended a probiotic in an effort to help regulate the fluctuation between diarrhea and constipation likely caused by alpha blocker therapy. She'll let us know if she passes a stone or stone material in the interval or has increased lower urinary tract symptoms, pain/discomfort, fevers. Precautionary urine culture sent today.   After a thorough review of the management options for the patient's condition the patient  elected to proceed with surgical  therapy as noted above. We have discussed the potential benefits and risks of the procedure, side effects of the proposed treatment, the likelihood of the patient achieving the goals of the procedure, and any potential problems that might occur during the procedure or recuperation. Informed consent has been obtained.

## 2018-03-16 ENCOUNTER — Ambulatory Visit (HOSPITAL_COMMUNITY): Payer: 59

## 2018-03-16 ENCOUNTER — Encounter (HOSPITAL_COMMUNITY)
Admission: RE | Disposition: A | Discharge status: Home / Self Care | Payer: Self-pay | Source: Home / Self Care | Attending: Urology

## 2018-03-16 ENCOUNTER — Ambulatory Visit (HOSPITAL_COMMUNITY)
Admission: RE | Admit: 2018-03-16 | Discharge: 2018-03-16 | Disposition: A | Discharge status: Home / Self Care | Payer: 59 | Attending: Urology | Admitting: Urology

## 2018-03-16 ENCOUNTER — Encounter (HOSPITAL_COMMUNITY): Payer: Self-pay | Admitting: *Deleted

## 2018-03-16 ENCOUNTER — Other Ambulatory Visit: Payer: Self-pay

## 2018-03-16 DIAGNOSIS — Z79899 Other long term (current) drug therapy: Secondary | ICD-10-CM | POA: Insufficient documentation

## 2018-03-16 DIAGNOSIS — N201 Calculus of ureter: Secondary | ICD-10-CM | POA: Diagnosis not present

## 2018-03-16 DIAGNOSIS — Z87442 Personal history of urinary calculi: Secondary | ICD-10-CM | POA: Diagnosis not present

## 2018-03-16 HISTORY — PX: EXTRACORPOREAL SHOCK WAVE LITHOTRIPSY: SHX1557

## 2018-03-16 SURGERY — LITHOTRIPSY, ESWL
Anesthesia: LOCAL | Laterality: Left

## 2018-03-16 MED ORDER — DIAZEPAM 5 MG PO TABS
10.0000 mg | ORAL_TABLET | ORAL | Status: AC
  Administered 2018-03-16: 10 mg via ORAL
  Filled 2018-03-16: qty 2

## 2018-03-16 MED ORDER — DIPHENHYDRAMINE HCL 25 MG PO CAPS
25.0000 mg | ORAL_CAPSULE | ORAL | Status: AC
  Administered 2018-03-16: 25 mg via ORAL
  Filled 2018-03-16: qty 1

## 2018-03-16 MED ORDER — CIPROFLOXACIN HCL 500 MG PO TABS
500.0000 mg | ORAL_TABLET | ORAL | Status: AC
  Administered 2018-03-16: 500 mg via ORAL
  Filled 2018-03-16: qty 1

## 2018-03-16 MED ORDER — SODIUM CHLORIDE 0.9 % IV SOLN
INTRAVENOUS | Status: DC
  Administered 2018-03-16: 07:00:00 via INTRAVENOUS

## 2018-03-16 NOTE — Discharge Instructions (Signed)
I have reviewed discharge instructions in detail with the patient. They will follow-up with me or their physician as scheduled. My nurse will also be calling the patients as per protocol.   

## 2018-03-16 NOTE — Interval H&P Note (Signed)
History and Physical Interval Note:  03/16/2018 7:22 AM  Tasha Lee  has presented today for surgery, with the diagnosis of LEFT URETERAL CALCULUS  The various methods of treatment have been discussed with the patient and family. After consideration of risks, benefits and other options for treatment, the patient has consented to  Procedure(s): EXTRACORPOREAL SHOCK WAVE LITHOTRIPSY (ESWL) (Left) as a surgical intervention .  The patient's history has been reviewed, patient examined, no change in status, stable for surgery.  I have reviewed the patient's chart and labs.  Questions were answered to the patient's satisfaction.     [name] A Tika Hannis

## 2018-03-17 ENCOUNTER — Encounter (HOSPITAL_COMMUNITY): Payer: Self-pay | Admitting: Urology

## 2018-03-30 DIAGNOSIS — N201 Calculus of ureter: Secondary | ICD-10-CM | POA: Diagnosis not present

## 2018-04-26 ENCOUNTER — Telehealth: Payer: Self-pay | Admitting: *Deleted

## 2018-04-26 DIAGNOSIS — N200 Calculus of kidney: Secondary | ICD-10-CM

## 2018-04-26 DIAGNOSIS — E7800 Pure hypercholesterolemia, unspecified: Secondary | ICD-10-CM

## 2018-04-26 NOTE — Telephone Encounter (Signed)
R/s'd patient for Welcome to [employer] for 08/10/2018 @ 3:15pm. She would like to come in 08/08/2018 for fasting labs if needed. Please let me know and I will order.

## 2018-04-27 NOTE — Addendum Note (Signed)
Addended byJoselyn Arrow on: 04/27/2018 12:41 PM   Modules accepted: Orders

## 2018-05-08 ENCOUNTER — Ambulatory Visit: Payer: Self-pay | Admitting: Family Medicine

## 2018-08-08 ENCOUNTER — Other Ambulatory Visit: Payer: Self-pay

## 2018-08-08 ENCOUNTER — Other Ambulatory Visit: Payer: Medicare Other

## 2018-08-08 DIAGNOSIS — E7800 Pure hypercholesterolemia, unspecified: Secondary | ICD-10-CM

## 2018-08-08 DIAGNOSIS — N200 Calculus of kidney: Secondary | ICD-10-CM

## 2018-08-09 LAB — COMPREHENSIVE METABOLIC PANEL
ALT: 12 IU/L (ref 0–32)
AST: 19 IU/L (ref 0–40)
Albumin/Globulin Ratio: 1.7 (ref 1.2–2.2)
Albumin: 4.2 g/dL (ref 3.8–4.8)
Alkaline Phosphatase: 82 IU/L (ref 39–117)
BUN/Creatinine Ratio: 22 (ref 12–28)
BUN: 20 mg/dL (ref 8–27)
Bilirubin Total: 0.4 mg/dL (ref 0.0–1.2)
CO2: 22 mmol/L (ref 20–29)
Calcium: 9.9 mg/dL (ref 8.7–10.3)
Chloride: 101 mmol/L (ref 96–106)
Creatinine, Ser: 0.93 mg/dL (ref 0.57–1.00)
GFR calc Af Amer: 75 mL/min/{1.73_m2} (ref >59)
GFR calc non Af Amer: 65 mL/min/{1.73_m2} (ref >59)
Globulin, Total: 2.5 g/dL (ref 1.5–4.5)
Glucose: 80 mg/dL (ref 65–99)
Potassium: 4.4 mmol/L (ref 3.5–5.2)
Sodium: 139 mmol/L (ref 134–144)
Total Protein: 6.7 g/dL (ref 6.0–8.5)

## 2018-08-09 LAB — CBC WITH DIFFERENTIAL/PLATELET
Basophils Absolute: 0 10*3/uL (ref 0.0–0.2)
Basos: 1 %
EOS (ABSOLUTE): 0.3 10*3/uL (ref 0.0–0.4)
Eos: 6 %
Hematocrit: 40.6 % (ref 34.0–46.6)
Hemoglobin: 13.2 g/dL (ref 11.1–15.9)
Immature Grans (Abs): 0 10*3/uL (ref 0.0–0.1)
Immature Granulocytes: 0 %
Lymphocytes Absolute: 1.9 10*3/uL (ref 0.7–3.1)
Lymphs: 41 %
MCH: 28.8 pg (ref 26.6–33.0)
MCHC: 32.5 g/dL (ref 31.5–35.7)
MCV: 89 fL (ref 79–97)
Monocytes Absolute: 0.3 10*3/uL (ref 0.1–0.9)
Monocytes: 7 %
Neutrophils Absolute: 2.1 10*3/uL (ref 1.4–7.0)
Neutrophils: 45 %
Platelets: 221 10*3/uL (ref 150–450)
RBC: 4.58 x10E6/uL (ref 3.77–5.28)
RDW: 12.9 % (ref 11.7–15.4)
WBC: 4.6 10*3/uL (ref 3.4–10.8)

## 2018-08-09 LAB — LIPID PANEL
Chol/HDL Ratio: 3.6 ratio (ref 0.0–4.4)
Cholesterol, Total: 227 mg/dL — ABNORMAL HIGH (ref 100–199)
HDL: 63 mg/dL (ref >39)
LDL Calculated: 151 mg/dL — ABNORMAL HIGH (ref 0–99)
Triglycerides: 67 mg/dL (ref 0–149)
VLDL Cholesterol Cal: 13 mg/dL (ref 5–40)

## 2018-08-09 NOTE — Patient Instructions (Addendum)
  HEALTH MAINTENANCE RECOMMENDATIONS:  It is recommended that you get at least 30 minutes of aerobic exercise at least 5 days/week (for weight loss, you may need as much as 60-90 minutes). This can be any activity that gets your heart rate up. This can be divided in 10-15 minute intervals if needed, but try and build up your endurance at least once a week.  Weight bearing exercise is also recommended twice weekly.  Eat a healthy diet with lots of vegetables, fruits and fiber.  "Colorful" foods have a lot of vitamins (ie green vegetables, tomatoes, red peppers, etc).  Limit sweet tea, regular sodas and alcoholic beverages, all of which has a lot of calories and sugar.  Up to 1 alcoholic drink daily may be beneficial for women (unless trying to lose weight, watch sugars).  Drink a lot of water.  Calcium recommendations are 1200-1500 mg daily (1500 mg for postmenopausal women or women without ovaries), and vitamin D 1000 IU daily.  This should be obtained from diet and/or supplements (vitamins), and calcium should not be taken all at once, but in divided doses.  Monthly self breast exams and yearly mammograms for women over the age of 65 is recommended.  Sunscreen of at least SPF 30 should be used on all sun-exposed parts of the skin when outside between the hours of 10 am and 4 pm (not just when at beach or pool, but even with exercise, golf, tennis, and yard work!)  Use a sunscreen that says "broad spectrum" so it covers both UVA and UVB rays, and make sure to reapply every 1-2 hours.  Remember to change the batteries in your smoke detectors when changing your clock times in the spring and fall.  Carbon monoxide detectors are recommended for your home.  Use your seat belt every time you are in a car, and please drive safely and not be distracted with cell phones and texting while driving.   This is a list of the screening recommended for you and due dates:  Health Maintenance  Topic Date Due  .  HIV Screening  04[birth year]02/1970--likely done with stem cell donation  . DEXA scan (bone density measurement)  04/03/2018  . Pneumonia vaccines (1 of 2 - PCV13) 04/03/2018  . Flu Shot  08/12/2018  . Pap Smear  04/27/2019  . Mammogram  09/30/2018  . Colon Cancer Screening  10/03/2021  . Tetanus Vaccine  04/29/2027  .  Hepatitis C: One time screening is recommended by Center for Disease Control  (CDC) for  adults born from [birth year] through 1967.   Completed   You were given prevnar-13 today. You will get pneumovax next year (these two injections make up the two pneumonia vaccines that are recommended after the age of 110).  Flu shots are recommended in the Fall (Sept/Oct--ignore the August date above).  Now that you are 63, I recommend that you get the "high dose" flu shot.  Your next pap can actually wait until 2023 (5 years from the last), since you had no high risk HPV on your last pap in 2018.  Bone density test is recommended now.  You can see if you can schedule this at the same time you are due for you mammogram, in September.  Have [facility] fax [place] over the order to be signed after you schedule it.

## 2018-08-09 NOTE — Progress Notes (Signed)
Chief Complaint  Patient presents with  . Annual Exam    Tasha Lee is a 63 y.o. female who presents for her annual physical exam and follow up on chronic medical conditions.  She had labs done prior to her visit today.  History of kidney and ureteral stones, under care of [facility]. She had another L ureteral stone, s/p lithotripsy in 03/2018. She passed this and is doing well.  Hyperlipidemia: Cholesterol has been borderline, with good HDL.  She tries to follow lowfat, low cholesterol diet.  Continues to help care for her in-laws in [place], which causes some stress.  Enjoys her grandkids (playing outside only, no picking them up); is trying to be careful regarding COVID-19.  Immunization History  Administered Date(s) Administered  . Influenza-Unspecified 11/02/2017  . Tdap 07/22/2011, 04/28/2017  . Zoster Recombinat (Shingrix) 04/26/2016, 06/28/2016   Last Pap smear:04/2016, normal (atrophy), no high risk HPV present Last mammogram:09/2017 Last colonoscopy:09/2011 normal; very tortuous colon, needed to complete with Barium Enema; Dr. Penelope Coop at [facility].Told 10 year follow-up Last DEXA: never Dentist: twice yearly Ophtho:yearly Exercise: walks35 minutes, 2times/week, plus a lot of yardwork (mows 2 yards/week, with push-mower) and babysitting (playing kickball with her 2.5 yo grandson) Vitamin D-OH 28 in 07/2011  Other doctors caring for patient include: Urologist:  [facility] (Dr. Junious Silk) Dentist: Dr. Gloriann Loan Ophtho: changing from Dr. Herbert Deaner (to [place] ophtho) GI:  Dr. Penelope Coop  Fall screen: none in the last year Depression screen:  Negative  End of Life Discussion: Has a living will and healthcare power of attorney.  Past Medical History:  Diagnosis Date  . Chronic kidney disease 10/2015  . History of kidney stones   . Left ureteral stone 10/2015    Past Surgical History:  Procedure Laterality Date  . COLONOSCOPY  2013   Tortuous colon, finished with  barium enema  . EXTRACORPOREAL SHOCK WAVE LITHOTRIPSY Left 01/19/2018   Procedure: LEFT EXTRACORPOREAL SHOCK WAVE LITHOTRIPSY (ESWL);  Surgeon: Ceasar Mons, MD;  Location: [facility];  Service: Urology;  Laterality: Left;  . EXTRACORPOREAL SHOCK WAVE LITHOTRIPSY Left 03/16/2018   Procedure: EXTRACORPOREAL SHOCK WAVE LITHOTRIPSY (ESWL);  Surgeon: Bjorn Loser, MD;  Location: [facility];  Service: Urology;  Laterality: Left;  . LITHOTRIPSY Left 11/2015    Social History   Socioeconomic History  . Marital status: Married    Spouse name: Not on file  . Number of children: Not on file  . Years of education: Not on file  . Highest education level: Not on file  Occupational History  . Occupation: homemaker (former Pharmacist, hospital at [employer])  Social Needs  . Financial resource strain: Not on file  . Food insecurity    Worry: Not on file    Inability: Not on file  . Transportation needs    Medical: Not on file    Non-medical: Not on file  Tobacco Use  . Smoking status: Never Smoker  . Smokeless tobacco: Never Used  Substance and Sexual Activity  . Alcohol use: No  . Drug use: No  . Sexual activity: Yes    Partners: Male  Lifestyle  . Physical activity    Days per week: Not on file    Minutes per session: Not on file  . Stress: Not on file  Relationships  . Social Herbalist on phone: Not on file    Gets together: Not on file    Attends religious service: Not on file    Active member of  club or organization: Not on file    Attends meetings of clubs or organizations: Not on file    Relationship status: Not on file  . Intimate partner violence    Fear of current or ex partner: Not on file    Emotionally abused: Not on file    Physically abused: Not on file    Forced sexual activity: Not on file  Other Topics Concern  . Not on file  Social History Narrative   Lives with husband.  Caregiver for in-laws (who live in [place], has in-home help, but they go  frequently).  Former Runner, broadcasting/film/videoteacher at [employer]. 2 daughters in [place], 1 grandson, 1 granddaughter   Husband with retinal detachments, h/o transglobal amnesia (related to stress), hip arthritis.  Doing well now      Updated 07/2018    Family History  Problem Relation Age of Onset  . Cancer Mother [id number]       breast  . Hypertension Mother   . Dementia Mother   . Kidney failure Mother   . Heart disease Mother        CHF  . Heart disease Father        first MI in 6650's; CABG 8471  . Dementia Father   . Diabetes Father   . Cancer Brother        prostate (50s), NonHodgkin's lymphome (60), leukemia (70)  . Leukemia Brother   . Lymphoma Brother   . Prostate cancer Brother   . Asthma Daughter     Outpatient Encounter Medications as of 08/10/2018  Medication Sig Note  . acetaminophen (TYLENOL) 500 MG tablet Take 500 mg by mouth as needed. Uses for HA's, but use is very rare.   . [DISCONTINUED] ondansetron (ZOFRAN) 4 MG tablet Take 1 tablet (4 mg total) by mouth every 8 (eight) hours as needed for nausea or vomiting. 03/16/2018: Pt has prescription in home but has not currently used   . [DISCONTINUED] oxyCODONE-acetaminophen (PERCOCET/ROXICET) 5-325 MG tablet Take 1-2 tablets by mouth every 6 (six) hours as needed for severe pain. 03/16/2018: Pt states has prescription in home but has not currently used   . [DISCONTINUED] tamsulosin (FLOMAX) 0.4 MG CAPS capsule Take 0.4 mg by mouth.    No facility-administered encounter medications on file as of 08/10/2018.     No Known Allergies   ROS: The patient denies anorexia, fever, weight changes, headaches, vision changes, decreased hearing, ear pain, sore throat, breast concerns, chest pain, palpitations, dizziness, syncope, dyspnea on exertion, cough, swelling, nausea, vomiting, diarrhea, constipation, abdominal pain, melena, hematochezia, indigestion/heartburn, hematuria, incontinence, dysuria, vaginal bleeding, discharge, odor or itch, genital lesions, joint pains,  numbness, tingling, weakness, tremor, suspicious skin lesions, depression, anxiety, abnormal bleeding/bruising, or enlarged lymph nodes.  +stress, causing some fatigue but overall moods are good. Rare urge incontinence Dry eyes, Refresh eye drops help a lot.   PHYSICAL EXAM:  BP 130/86   Pulse 86   Temp 98.5 F (36.9 C) (Oral)   Ht 5' 7.75" (1.721 m)   Wt 159 lb (72.1 kg)   SpO2 97%   BMI 24.35 kg/m   Wt Readings from Last 3 Encounters:  08/10/18 159 lb (72.1 kg)  03/16/18 164 lb 2 oz (74.4 kg)  01/19/18 166 lb 4 oz (75.4 kg)    General Appearance:  Alert, cooperative, no distress, appears stated age  Head:  Normocephalic, without obvious abnormality, atraumatic  Eyes:  PERRL, conjunctiva/corneas clear, EOM's intact, fundi benign  Ears:  Not examined (wearing  mask due to COVID-19)  Nose:   Not examined (wearing mask due to COVID-19)  Throat: Lips, mucosa, and tongue normal; teeth and gums normal  Neck: Supple, no lymphadenopathy; thyroid: no enlargement/tenderness/ nodules; no carotid bruit or JVD  Back:  Spine nontender, no curvature, ROM normal,noCVAtenderness  Lungs:  Clear to auscultation bilaterally without wheezes, rales orronchi; respirations unlabored  Chest Wall:  No tenderness or deformity  Heart:  Regular rate and rhythm, S1 and S2 normal, no murmur, rub or gallop  Breast Exam:  No tenderness, masses, or nipple discharge or inversion. No axillary lymphadenopathy  Abdomen:  Soft, non-tender, nondistended, normoactive bowel sounds,  no masses, no hepatosplenomegaly  Genitalia:  Normal external genitalia without lesions; atrophic changes noted. BUS and vagina normal. No cervical motion tenderness. No abnormal vaginal discharge. Uterus and adnexa not enlarged, nontender, no masses. Pap not performed  Rectal:  Normal tone, no masses or tenderness; guaiac negative stool. External hemorrhoids, nontender  Extremities: No  clubbing, cyanosis or edema  Pulses: 2+ and symmetric all extremities  Skin: Skin color, texture, turgor normal, no rashes or lesions. 0.5 cm mole in upper mid back, somewhat darker in central area, unchanged.Many angioma on abdomen.  Lymph nodes: Cervical, supraclavicular, and axillary nodes normal  Neurologic: CNII-XII intact, normal strength, sensation and gait; reflexes 2+ and symmetric throughout  Psych: Normal mood, affect, hygiene and grooming.    Chemistry      Component Value Date/Time   NA 139 08/08/2018 0830   K 4.4 08/08/2018 0830   CL 101 08/08/2018 0830   CO2 22 08/08/2018 0830   BUN 20 08/08/2018 0830   CREATININE 0.93 08/08/2018 0830   CREATININE 0.74 04/26/2016 0937      Component Value Date/Time   CALCIUM 9.9 08/08/2018 0830   ALKPHOS 82 08/08/2018 0830   AST 19 08/08/2018 0830   ALT 12 08/08/2018 0830   BILITOT 0.4 08/08/2018 0830     Fasting glucose 80  Lab Results  Component Value Date   CHOL 227 (H) 08/08/2018   HDL 63 08/08/2018   LDLCALC 151 (H) 08/08/2018   TRIG 67 08/08/2018   CHOLHDL 3.6 08/08/2018   Lab Results  Component Value Date   WBC 4.6 08/08/2018   HGB 13.2 08/08/2018   HCT 40.6 08/08/2018   MCV 89 08/08/2018   PLT 221 08/08/2018     ASSESSMENT/PLAN:  Annual physical exam   Hypercholesterolemia - borderline; normal chol/HDL ratio.  ASCVD borderline. Cont lowfat, low cholesterol diet, regular exercise   Left ureteral stone - s/p lithotripsy and after 2 years, is finally doing better. Tries to do as much lemon as tolerable for prevention (causes diarrhea)   Immunization due - Plan: Pneumococcal conjugate vaccine 13-valent  DEXA--gets mammo's at [facility], can do together in September. She will call to schedule (and they can fax order for signature).  Discussed monthly self breast exams and yearly mammograms; at least 30 minutes of aerobic activity at least 5 days/week, weight-bearing  exercise at least 2x/wk; proper sunscreen use reviewed; healthy diet, including goals of calcium and vitamin D intake and alcohol recommendations (less than or equal to 1 drink/day) reviewed; regular seatbelt use; changing batteries in smoke detectors. Immunization recommendations discussed. Yearly flu shots encouraged (now high dose).Prevnar-13 given, pneumovax next year.Colonoscopy recommendations reviewed--UTD, next due 2023.Consider Cologard when next due (discussed).  DEXA recommended, to schedule with her next mammo at [facility].  F/u 1 year, sooner prn.

## 2018-08-10 ENCOUNTER — Other Ambulatory Visit: Payer: Self-pay

## 2018-08-10 ENCOUNTER — Encounter: Payer: Self-pay | Admitting: Family Medicine

## 2018-08-10 ENCOUNTER — Ambulatory Visit (INDEPENDENT_AMBULATORY_CARE_PROVIDER_SITE_OTHER): Payer: 59 | Admitting: Family Medicine

## 2018-08-10 VITALS — BP 130/86 | HR 86 | Temp 98.5°F | Ht 67.75 in | Wt 159.0 lb

## 2018-08-10 DIAGNOSIS — Z23 Encounter for immunization: Secondary | ICD-10-CM

## 2018-08-10 DIAGNOSIS — E7800 Pure hypercholesterolemia, unspecified: Secondary | ICD-10-CM | POA: Diagnosis not present

## 2018-08-10 DIAGNOSIS — Z0000 Encounter for general adult medical examination without abnormal findings: Secondary | ICD-10-CM

## 2018-08-10 DIAGNOSIS — N201 Calculus of ureter: Secondary | ICD-10-CM | POA: Diagnosis not present

## 2018-09-15 ENCOUNTER — Telehealth: Payer: Self-pay | Admitting: Family Medicine

## 2018-09-15 NOTE — Telephone Encounter (Signed)
Pt called and has her appt.for her bone density set up for 9/18 but they told her she needed an order for it before her appt.

## 2018-09-15 NOTE — Telephone Encounter (Signed)
Usually [facility] faxes over an order for [place] to sign.  If this isn't the case this time, please fax order to [facility] for DEXA, dx postmenopausal estrogen deficiency.  Thanks

## 2018-09-15 NOTE — Telephone Encounter (Signed)
Order has been faxed over 

## 2018-09-29 LAB — HM MAMMOGRAPHY

## 2018-09-29 LAB — HM DEXA SCAN

## 2018-10-02 ENCOUNTER — Encounter: Payer: Self-pay | Admitting: Family Medicine

## 2018-10-02 ENCOUNTER — Encounter: Payer: Self-pay | Admitting: *Deleted

## 2019-02-11 ENCOUNTER — Ambulatory Visit: Payer: Medicare Other

## 2019-02-16 ENCOUNTER — Ambulatory Visit: Payer: Medicare Other

## 2019-02-17 ENCOUNTER — Ambulatory Visit: Payer: 59 | Attending: Internal Medicine

## 2019-02-17 DIAGNOSIS — Z23 Encounter for immunization: Secondary | ICD-10-CM | POA: Insufficient documentation

## 2019-02-17 NOTE — Progress Notes (Signed)
   Covid-19 Vaccination Clinic  Name:  Tasha Lee    MRN: [id number] DOB: [birth year]  02/17/2019  [name] was observed post Covid-19 immunization for 15 minutes without incidence. She was provided with Vaccine Information Sheet and instruction to access the V-Safe system.   [name] was instructed to call 911 with any severe reactions post vaccine: Marland Kitchen Difficulty breathing  . Swelling of your face and throat  . A fast heartbeat  . A bad rash all over your body  . Dizziness and weakness    Immunizations Administered    Name Date Dose VIS Date Route   Pfizer COVID-19 Vaccine 02/17/2019  8:10 AM 0.3 mL 12/22/2018 Intramuscular   Manufacturer: [employer], [employer]   Lot: [id number]   NDC: [address]-4

## 2019-03-14 ENCOUNTER — Ambulatory Visit: Payer: 59 | Attending: Internal Medicine

## 2019-03-14 DIAGNOSIS — Z23 Encounter for immunization: Secondary | ICD-10-CM | POA: Insufficient documentation

## 2019-03-14 NOTE — Progress Notes (Signed)
   Covid-19 Vaccination Clinic  Name:  Tasha Lee    MRN: [id number] DOB: [birth year]  03/14/2019  [name] was observed post Covid-19 immunization for 15 minutes without incident. She was provided with Vaccine Information Sheet and instruction to access the V-Safe system.   [name] was instructed to call 911 with any severe reactions post vaccine: Marland Kitchen Difficulty breathing  . Swelling of face and throat  . A fast heartbeat  . A bad rash all over body  . Dizziness and weakness   Immunizations Administered    Name Date Dose VIS Date Route   Pfizer COVID-19 Vaccine 03/14/2019  8:55 AM 0.3 mL 12/22/2018 Intramuscular   Manufacturer: [employer], [employer]   Lot: [id number]   NDC: [address]-8

## 2019-08-19 NOTE — Progress Notes (Signed)
Chief Complaint  Patient presents with  . Medicare Wellness    fasting CPE, no new concerns.    Tasha Lee is a 64 y.o. female who presents for a complete physical.   She has lost weight--changed her diet related to her husband's health/diet.  They cut back on red meat, eating more chicken, fish, ground [place], fresh vegetables from the garden, eating a lot of fruit.  She has stopped baking (only rarely now).  Eats her heavier meal in mid-day.  Hyperlipidemia: Cholesterol has been borderline, with good HDL. She tries to follow lowfat, low cholesterol diet. ASCVD risk borderline last year.  Due for recheck. As reported above, diet has improved. Lab Results  Component Value Date   CHOL 227 (H) 08/08/2018   HDL 63 08/08/2018   LDLCALC 151 (H) 08/08/2018   TRIG 67 08/08/2018   CHOLHDL 3.6 08/08/2018   History of kidney and ureteral stones, under care of [facility].Last issue was with L ureteral stone, s/p lithotripsy in 03/2018. She currently denies any problems. She has a f/u appt scheduled with Dr. Mena Goes for September (goes yearly).  Elderly in-laws live in [place]. They are both now in memory care in assisted living, and they sold their house. They don't go as often now, less travel/stress.  Her husband to continues to work a lot.  Immunization History  Administered Date(s) Administered  . Influenza, High Dose Seasonal PF 10/01/2018  . Influenza,inj,Quad PF,6+ Mos 11/02/2017  . Influenza-Unspecified 11/02/2017  . PFIZER SARS-COV-2 Vaccination 02/17/2019, 03/14/2019  . Pneumococcal Conjugate-13 08/10/2018  . Tdap 07/22/2011, 04/28/2017  . Zoster Recombinat (Shingrix) 04/26/2016, 06/28/2016   Last Pap smear:04/2016, normal (atrophy), no high risk HPV present Last mammogram:09/2018 Last colonoscopy:09/2011 normal; very tortuous colon, needed to complete with Barium Enema; Dr. Evette Cristal at [facility].Told 10 year follow-up Last DEXA: 09/2018 T-1.7 spine (done at  [facility]) Dentist: twice yearly Ophtho:yearly, past due Exercise: walks 3x/week x 20 mins.  Push-mows yard once a week.  Active with her grandchildren.  Carries 3 yo grandson a lot.  Vitamin D-OH 47 in 07/2011  Other doctors caring for patient include: Urologist:  [facility] (Dr. Mena Goes) Dentist: Dr. Alvester Morin Ophtho: changing from Dr. Elmer Picker (to [place] ophtho), hasn't scheduled yet GI:  Dr. Evette Cristal  Fall screen: none in the last year Depression screen:  Negative Functional status survey unremarkable  See epic for full screens.  End of Life Discussion: Has a living will and healthcare power of attorney.   PMH, PSH, SH and FH reviewed and updated  Outpatient Encounter Medications as of 08/20/2019  Medication Sig  . Multiple Vitamins-Minerals (CENTRUM SILVER 50+WOMEN PO) Take 1 tablet by mouth daily.  Marland Kitchen acetaminophen (TYLENOL) 500 MG tablet Take 500 mg by mouth as needed. Uses for HA's, but use is very rare. (Patient not taking: Reported on 08/20/2019)   No facility-administered encounter medications on file as of 08/20/2019.   No Known Allergies  ROS: The patient denies anorexia, fever, headaches, vision changes, decreased hearing, ear pain, sore throat, breast concerns, chest pain, palpitations, dizziness, syncope, dyspnea on exertion, cough, swelling, nausea, vomiting, diarrhea, constipation, abdominal pain, melena, hematochezia, indigestion/heartburn, hematuria, dysuria, vaginal bleeding, discharge, odor or itch, genital lesions, joint pains, numbness, tingling, weakness, tremor, suspicious skin lesions, depression, anxiety, abnormal bleeding/bruising, or enlarged lymph nodes. Rare urge incontinence Rare hot flash Dry eyes, using Refresh eye drops Intentional weight loss with healthier diet (mainly for her husband)   PHYSICAL EXAM:  BP 130/72   Pulse 80  Ht 5\' 8"  (1.727 m)   Wt 150 lb 6.4 oz (68.2 kg)   BMI 22.87 kg/m   Wt Readings from Last 3 Encounters:  08/20/19 150 lb 6.4 oz  (68.2 kg)  08/10/18 159 lb (72.1 kg)  03/16/18 164 lb 2 oz (74.4 kg)    General Appearance:  Alert, cooperative, no distress, appears stated age  Head:  Normocephalic, without obvious abnormality, atraumatic  Eyes:  PERRL, conjunctiva/corneas clear, EOM's intact, fundi benign  Ears:  Not examined (wearing mask due to COVID-19)  Nose:   Not examined (wearing mask due to COVID-19)  Throat: Lips, mucosa, and tongue normal; teeth and gums normal  Neck: Supple, no lymphadenopathy; thyroid: no enlargement/tenderness/ nodules; no carotid bruit or JVD  Back:  Spine nontender, no curvature, ROM normal,noCVAtenderness  Lungs:  Clear to auscultation bilaterally without wheezes, rales orronchi; respirations unlabored  Chest Wall:  No tenderness or deformity  Heart:  Regular rate and rhythm, S1 and S2 normal, no murmur, rub or gallop  Breast Exam:  No tenderness, masses, or nipple discharge or inversion. No axillary lymphadenopathy  Abdomen:  Soft, non-tender, nondistended, normoactive bowel sounds,  no masses, no hepatosplenomegaly  Genitalia:  Normal external genitalia without lesions; atrophic changes noted. BUS and vagina normal. Nocervical motion tenderness. No abnormal vaginal discharge. Uterus and adnexa not enlarged, nontender, no masses. Papnotperformed  Rectal:  Normal tone, no masses or tenderness; guaiac negative stool.Non-inflamed external hemorrhoids  Extremities: No clubbing, cyanosis or edema  Pulses: 2+ and symmetric all extremities  Skin: Skin color, texture, turgor normal, no rashes or lesions. 0.5 cm mole in upper mid back, edges are a little lighter in color, unchanged.Many angioma on abdomen.  Lymph nodes: Cervical, supraclavicular, and axillary nodes normal  Neurologic: Normal strength, sensation and gait; reflexes 2+ and symmetric throughout  Psych: Normal mood, affect, hygiene and  grooming.  EKG: NSR.  Tracing not great (thickened in leads II and III), some nonspecific changes.  No acute changes/concerns.  This is a baseline for her.   ASSESSMENT/PLAN:  Annual physical exam - Plan: Comprehensive metabolic panel, CBC with Differential/Platelet, Lipid panel, EKG 12-Lead  Hypercholesterolemia - due for recheck; continue lowfat, low cholesterol diet - Plan: Lipid panel  Need for pneumococcal vaccination - Plan: Pneumococcal polysaccharide vaccine 23-valent greater than or equal to 2yo subcutaneous/IM  Will send copy of labs to [facility] (has upcoming appt in September)  Discussed monthly self breast exams and yearly mammograms; at least 30 minutes of aerobic activity at least 5 days/week, weight-bearing exercise at least 2x/wk; proper sunscreen use reviewed; healthy diet, including goals of calcium and vitamin D intake and alcohol recommendations (less than or equal to 1 drink/day) reviewed; regular seatbelt use; changing batteries in smoke detectors. Immunization recommendations discussed--yearly high dose flu shots. Pneumovax given today. Colon cancer screening recommendations reviewed--UTD, next due 2023 (previously discussed that she may consider Cologard when next due). Pap due again 04/2021.  F/u 1 year, sooner prn.

## 2019-08-19 NOTE — Patient Instructions (Signed)

## 2019-08-20 ENCOUNTER — Encounter: Payer: Self-pay | Admitting: Family Medicine

## 2019-08-20 ENCOUNTER — Ambulatory Visit (INDEPENDENT_AMBULATORY_CARE_PROVIDER_SITE_OTHER): Payer: 59 | Admitting: Family Medicine

## 2019-08-20 ENCOUNTER — Other Ambulatory Visit: Payer: Self-pay

## 2019-08-20 VITALS — BP 130/72 | HR 80 | Ht 68.0 in | Wt 150.4 lb

## 2019-08-20 DIAGNOSIS — Z0000 Encounter for general adult medical examination without abnormal findings: Secondary | ICD-10-CM | POA: Diagnosis not present

## 2019-08-20 DIAGNOSIS — Z23 Encounter for immunization: Secondary | ICD-10-CM | POA: Diagnosis not present

## 2019-08-20 DIAGNOSIS — E7800 Pure hypercholesterolemia, unspecified: Secondary | ICD-10-CM

## 2019-08-21 LAB — LIPID PANEL
Chol/HDL Ratio: 3.6 ratio (ref 0.0–4.4)
Cholesterol, Total: 229 mg/dL — ABNORMAL HIGH (ref 100–199)
HDL: 64 mg/dL (ref >39)
LDL Chol Calc (NIH): 149 mg/dL — ABNORMAL HIGH (ref 0–99)
Triglycerides: 89 mg/dL (ref 0–149)
VLDL Cholesterol Cal: 16 mg/dL (ref 5–40)

## 2019-08-21 LAB — COMPREHENSIVE METABOLIC PANEL
ALT: 18 IU/L (ref 0–32)
AST: 23 IU/L (ref 0–40)
Albumin/Globulin Ratio: 1.6 (ref 1.2–2.2)
Albumin: 4.4 g/dL (ref 3.8–4.8)
Alkaline Phosphatase: 84 IU/L (ref 48–121)
BUN/Creatinine Ratio: 21 (ref 12–28)
BUN: 16 mg/dL (ref 8–27)
Bilirubin Total: 0.4 mg/dL (ref 0.0–1.2)
CO2: 24 mmol/L (ref 20–29)
Calcium: 9.7 mg/dL (ref 8.7–10.3)
Chloride: 103 mmol/L (ref 96–106)
Creatinine, Ser: 0.78 mg/dL (ref 0.57–1.00)
GFR calc Af Amer: 92 mL/min/{1.73_m2} (ref >59)
GFR calc non Af Amer: 79 mL/min/{1.73_m2} (ref >59)
Globulin, Total: 2.7 g/dL (ref 1.5–4.5)
Glucose: 85 mg/dL (ref 65–99)
Potassium: 4.4 mmol/L (ref 3.5–5.2)
Sodium: 142 mmol/L (ref 134–144)
Total Protein: 7.1 g/dL (ref 6.0–8.5)

## 2019-08-21 LAB — CBC WITH DIFFERENTIAL/PLATELET
Basophils Absolute: 0 10*3/uL (ref 0.0–0.2)
Basos: 1 %
EOS (ABSOLUTE): 0.2 10*3/uL (ref 0.0–0.4)
Eos: 4 %
Hematocrit: 39.4 % (ref 34.0–46.6)
Hemoglobin: 13.3 g/dL (ref 11.1–15.9)
Immature Grans (Abs): 0 10*3/uL (ref 0.0–0.1)
Immature Granulocytes: 0 %
Lymphocytes Absolute: 1.3 10*3/uL (ref 0.7–3.1)
Lymphs: 24 %
MCH: 29.2 pg (ref 26.6–33.0)
MCHC: 33.8 g/dL (ref 31.5–35.7)
MCV: 87 fL (ref 79–97)
Monocytes Absolute: 0.4 10*3/uL (ref 0.1–0.9)
Monocytes: 7 %
Neutrophils Absolute: 3.5 10*3/uL (ref 1.4–7.0)
Neutrophils: 64 %
Platelets: 227 10*3/uL (ref 150–450)
RBC: 4.55 x10E6/uL (ref 3.77–5.28)
RDW: 12.5 % (ref 11.7–15.4)
WBC: 5.4 10*3/uL (ref 3.4–10.8)

## 2019-09-17 IMAGING — CR DG ABDOMEN 1V
2 series · 2 of 2 positions shown · non-contrast
Comparison: 03/07/2018 outside abdominal radiograph

CLINICAL DATA: Follow-up left ureteral stone

EXAM:
ABDOMEN - 1 VIEW

[t abdomen supine (1 of 2)]
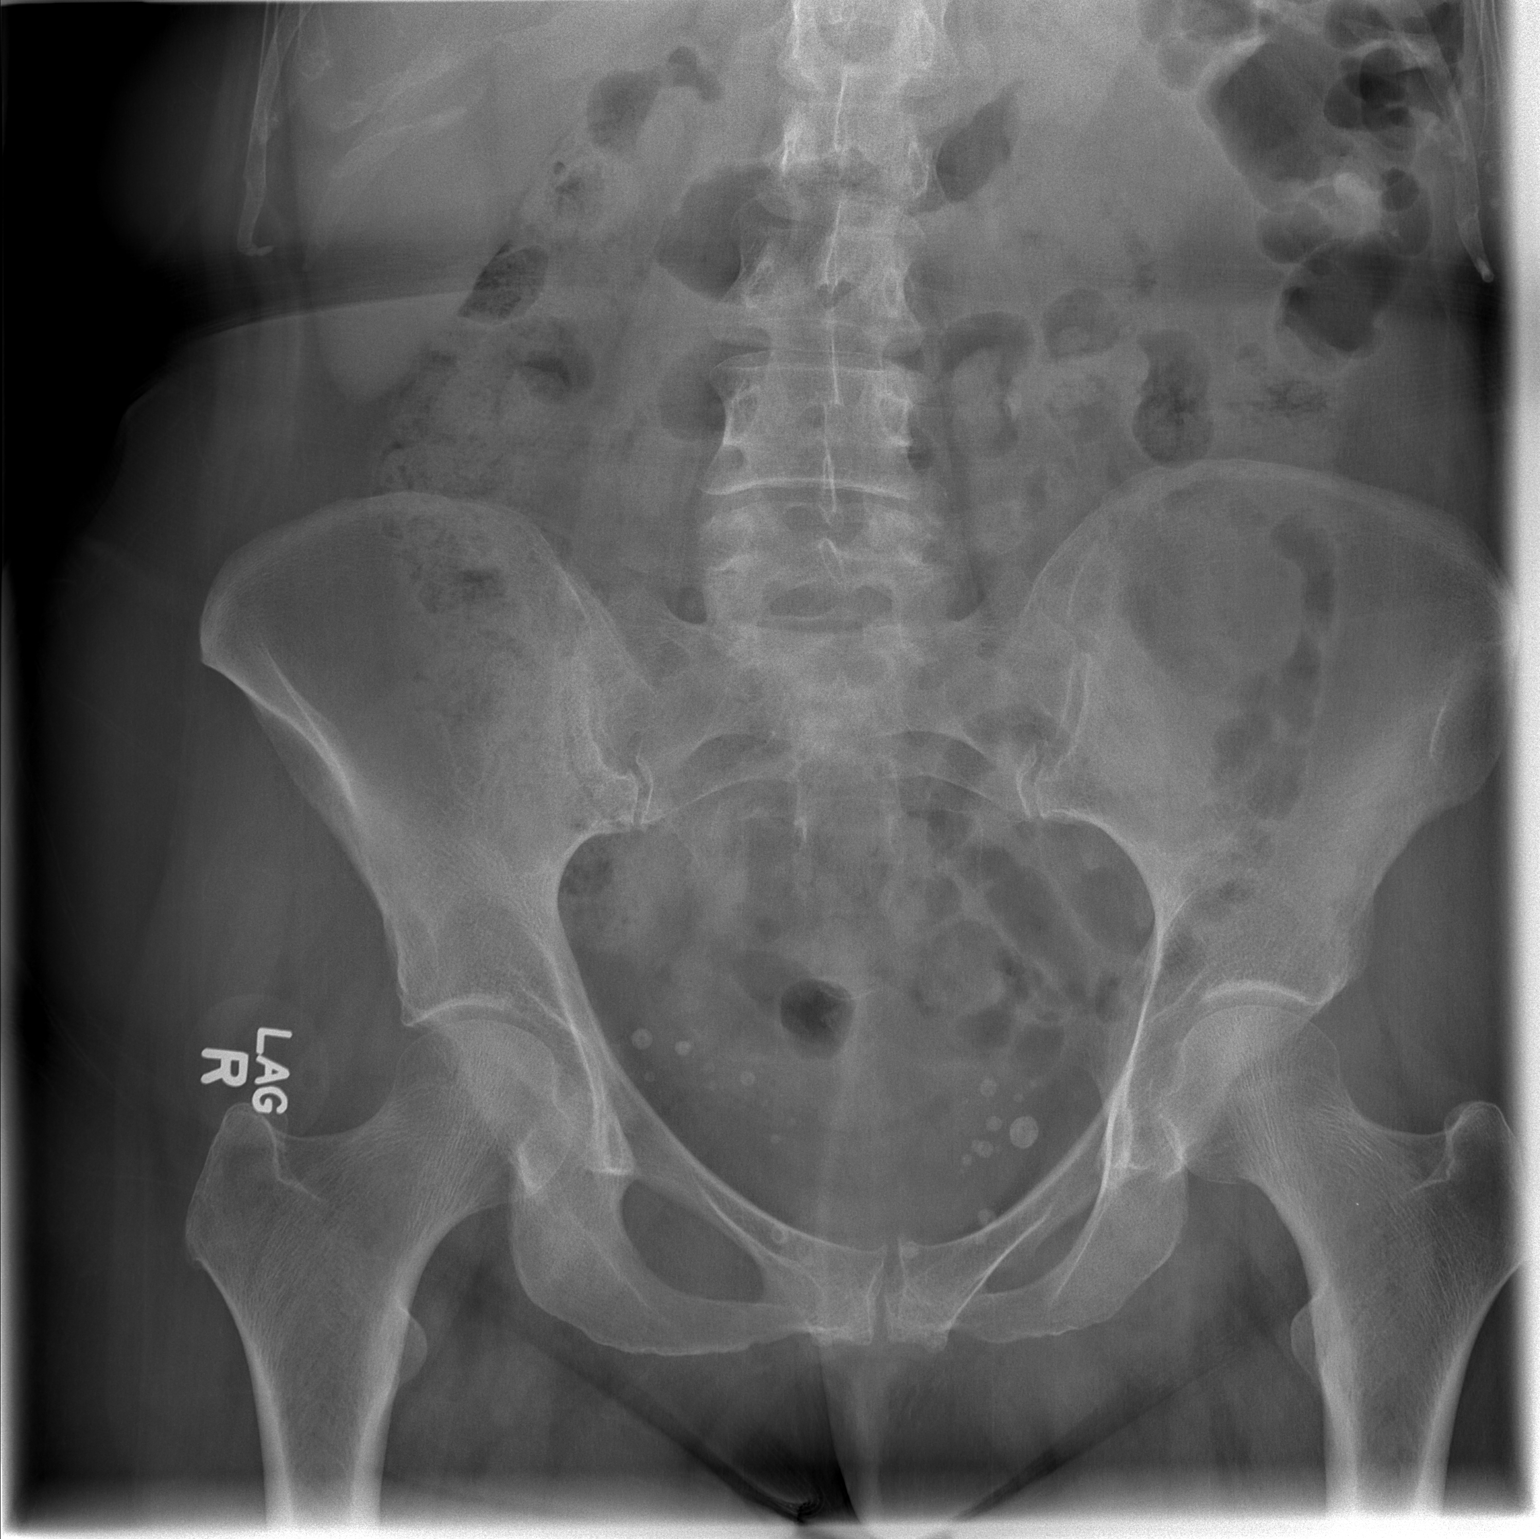

[t abdomen supine (2 of 2)]
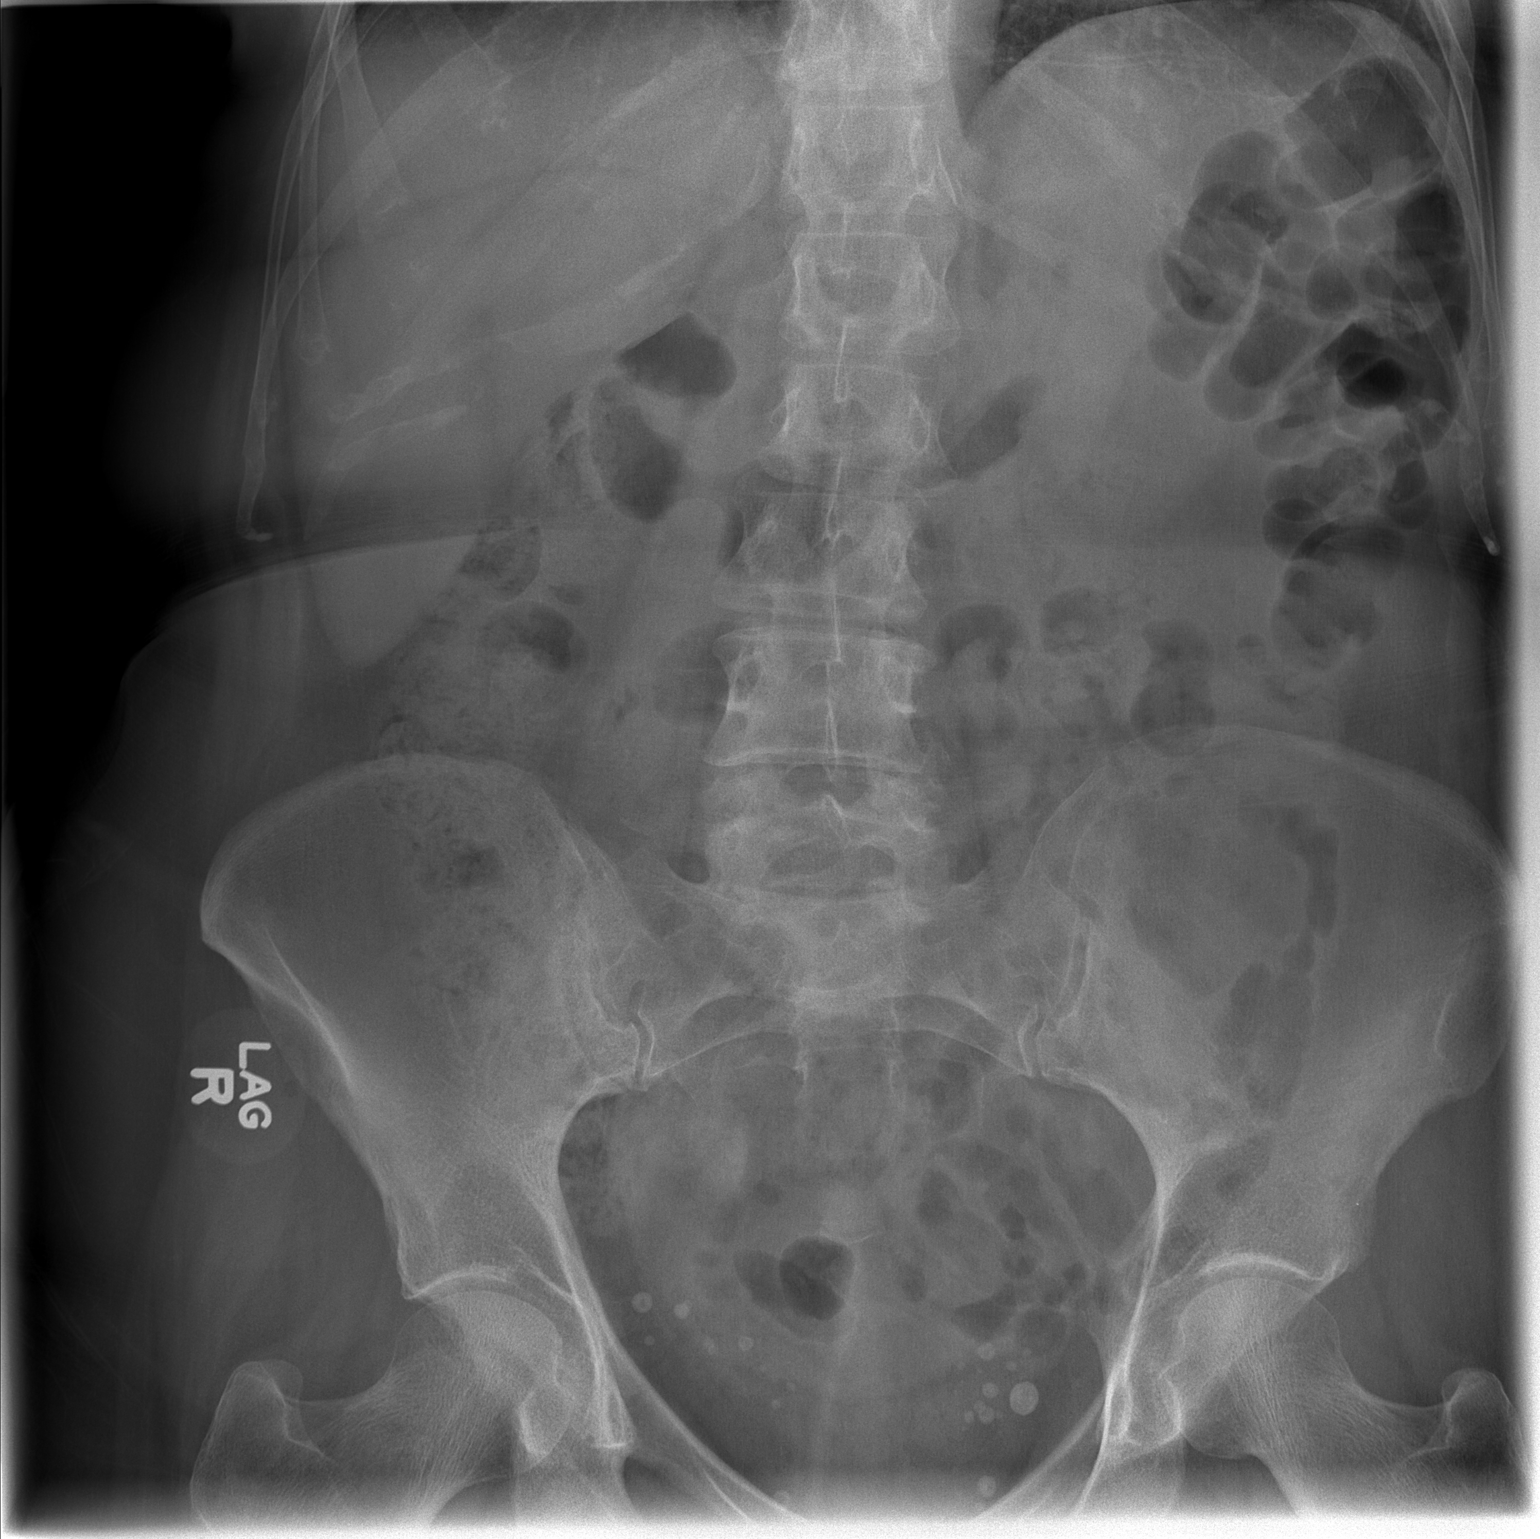

[2 of 2 positions shown; findings below may reference images not displayed]

FINDINGS: Stable 8 mm calcification to the left of the L4 vertebral body. No
additional radiopaque stones. Stable calcified phleboliths in the
deep pelvis bilaterally. No dilated small bowel loops. Mild colonic
stool. No evidence of pneumatosis or pneumoperitoneum.
IMPRESSION: Stable 8 mm calcification to the left of the L4 vertebral body
compatible with known left ureteral stone.

## 2019-10-12 LAB — HM MAMMOGRAPHY

## 2019-10-15 ENCOUNTER — Encounter: Payer: Self-pay | Admitting: Family Medicine

## 2020-08-19 DIAGNOSIS — M8588 Other specified disorders of bone density and structure, other site: Secondary | ICD-10-CM | POA: Insufficient documentation

## 2020-08-19 NOTE — Progress Notes (Signed)
Chief Complaint  Patient presents with   Medicare Wellness    Fasting CPE, husband is still working. No new concerns.    Tasha Lee is a 65 y.o. female who presents for a complete physical.   Hyperlipidemia: Cholesterol has been borderline, with good HDL.  She tries to follow lowfat, low cholesterol diet.  Last year she reported eating less red meat, more chicken, fish, ground [place], fruit and fresh vegetables from the garden. Diet remains the same. Lab Results  Component Value Date   CHOL 229 (H) 08/20/2019   HDL 64 08/20/2019   LDLCALC 149 (H) 08/20/2019   TRIG 89 08/20/2019   CHOLHDL 3.6 08/20/2019   History of kidney and ureteral stones, under care of [facility]. Last seen 03/2020.  She currently denies any problems.    Osteopenia:  She was found to have osteopenia on DEXA in 09/2018, T-1.7 at spine. She takes calcium +D supplement.  Getting less weight-bearing exercise than last year, since her grandchildren moved away.   Father-in-law passed away last year, Mother-in-law is in skilled nursing in [place] (since COVID pneumonia, prev in memory care; rough year, SDH, falls, fractures) Daughter and grandchildren moved to [place]. That was an adjustment, but she is doing well.  Enjoys gardening, sewing.  She is thinking about volunteering at the hospital.  Immunization History  Administered Date(s) Administered   Influenza, High Dose Seasonal PF 10/01/2018, 10/12/2019   Influenza,inj,Quad PF,6+ Mos 11/02/2017   Influenza-Unspecified 11/02/2017   PFIZER(Purple Top)SARS-COV-2 Vaccination 02/17/2019, 03/14/2019, 10/12/2019, 08/03/2020   Pneumococcal Conjugate-13 08/10/2018   Pneumococcal Polysaccharide-23 08/20/2019   Tdap 07/22/2011, 04/28/2017   Zoster Recombinat (Shingrix) 04/26/2016, 06/28/2016   Last Pap smear: 04/2016, normal (atrophy), no high risk HPV present Last mammogram: 10/2019 Last colonoscopy: 09/2011 normal; very tortuous colon, needed to  complete with Barium Enema; Dr. Evette Cristal at [facility]. Told 10 year follow-up Last DEXA: 09/2018 T-1.7 spine (done at [facility]) Dentist: twice yearly Ophtho: past due Exercise: walks 5x/week x 25-30 mins.  Push-mows yard once a week.  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 Grandchildren moved away.  Vitamin D-OH 47 in 07/2011   Other doctors caring for patient include: Urologist:  [facility] (Dr. Mena Goes) Dentist: Dr. Alvester Morin Ophtho: plans to see [facility] Otto ophtho (prev saw Dr. Elmer Picker) GI:  Dr. Evette Cristal   Fall screen: none in the last year Depression screen:  Negative Functional status survey unremarkable Mini-Cog normal  See epic for full screens.  End of Life Discussion: Has a living will and healthcare power of attorney.   PMH, PSH, SH and FH reviewed and updated  Outpatient Encounter Medications as of 08/20/2020  Medication Sig   calcium citrate-vitamin D (CITRACAL+D) 315-200 MG-UNIT tablet Take 1 tablet by mouth daily.   [DISCONTINUED] Calcium Carbonate-Vitamin D 600-400 MG-UNIT tablet Take 1 tablet by mouth daily.   acetaminophen (TYLENOL) 500 MG tablet Take 500 mg by mouth as needed. Uses for HA's, but use is very rare. (Patient not taking: No sig reported)   [DISCONTINUED] Multiple Vitamins-Minerals (CENTRUM SILVER 50+WOMEN PO) Take 1 tablet by mouth daily.   No facility-administered encounter medications on file as of 08/20/2020.   No Known Allergies   ROS:  The patient denies anorexia, fever, headaches, vision changes, decreased hearing, ear pain, sore throat, breast concerns, chest pain, palpitations, dizziness, syncope, dyspnea on exertion, cough, swelling, nausea, vomiting, diarrhea, constipation, abdominal pain, melena, hematochezia, indigestion/heartburn, urinary incontinence, hematuria, dysuria, vaginal bleeding, discharge, odor or itch, genital lesions, joint pains, numbness, tingling,  weakness, tremor, suspicious skin lesions, depression, anxiety, abnormal  bleeding/bruising, or enlarged lymph nodes.  Some dry eyes, worse in the summer, relieved by using Refresh    PHYSICAL EXAM:  BP 128/62   Pulse 68   Ht 5\' 8"  (1.727 m)   Wt 152 lb 12.8 oz (69.3 kg)   BMI 23.23 kg/m   Wt Readings from Last 3 Encounters:  08/20/20 152 lb 12.8 oz (69.3 kg)  08/20/19 150 lb 6.4 oz (68.2 kg)  08/10/18 159 lb (72.1 kg)    General Appearance:    Alert, cooperative, no distress, appears stated age  Head:    Normocephalic, without obvious abnormality, atraumatic  Eyes:    PERRL, conjunctiva/corneas clear, EOM's intact, fundi benign  Ears:    Not examined (wearing mask due to COVID-19)  Nose:   Not examined (wearing mask due to COVID-19)  Throat:   Lips, mucosa, and tongue normal; teeth and gums normal  Neck:   Supple, no lymphadenopathy;  thyroid:  no enlargement/tenderness/ nodules; no carotid bruit or JVD  Back:    Spine nontender, no curvature, ROM normal, no CVA tenderness  Lungs:     Clear to auscultation bilaterally without wheezes, rales or ronchi; respirations unlabored  Chest Wall:    No tenderness or deformity   Heart:    Regular rate and rhythm, S1 and S2 normal, no murmur, rub or gallop  Breast Exam:    No tenderness, masses, or nipple discharge or inversion.  No axillary lymphadenopathy  Abdomen:     Soft, non-tender, nondistended, normoactive bowel sounds, no masses, no hepatosplenomegaly  Genitalia:    Normal external genitalia without lesions; atrophic changes noted.  BUS and vagina normal. No cervical motion tenderness. No abnormal vaginal discharge.  Uterus and adnexa not enlarged, nontender, no masses.  Pap not performed  Rectal:    Normal tone, no masses or tenderness; guaiac negative stool.  Non-inflamed external hemorrhoids  Extremities:   No clubbing, cyanosis or edema  Pulses:   2+ and symmetric all extremities  Skin:   Skin color, texture, turgor normal, no rashes or lesions. 0.5 cm mole in upper mid back--central portion is  slightly raised, edges are a little lighter in color and flat, unchanged. Many angioma on abdomen.  Lymph nodes:   Cervical, supraclavicular, and axillary nodes normal  Neurologic:   Normal strength, sensation and gait; reflexes 2+ and symmetric throughout                 Psych:  Normal mood, affect, hygiene and grooming.   ASSESSMENT/PLAN:  Annual physical exam - Plan: Comprehensive metabolic panel, CBC with Differential/Platelet, Lipid panel, VITAMIN D 25 Hydroxy (Vit-D Deficiency, Fractures)  Hypercholesterolemia - continue lowfat, low cholesterol diet.  - Plan: Lipid panel  Osteopenia of lumbar spine - discussed Ca, D and weight-bearing exercise.  Recommend repeat DEXA when getting next mammo in 10/2020. - Plan: VITAMIN D 25 Hydroxy (Vit-D Deficiency, Fractures)   Discussed monthly self breast exams and yearly mammograms; at least 30 minutes of aerobic activity at least 5 days/week, weight-bearing exercise at least 2x/wk; proper sunscreen use reviewed; healthy diet, including goals of calcium and vitamin D intake and alcohol recommendations (less than or equal to 1 drink/day) reviewed; regular seatbelt use; changing batteries in smoke detectors.  Immunization recommendations discussed--continue yearly high dose flu shots. Colon cancer screening recommendations reviewed--UTD, next due 2023 (previously discussed that she may consider Cologard when next due).   Pap next year.   F/u 1  year, sooner prn.

## 2020-08-19 NOTE — Patient Instructions (Addendum)
  HEALTH MAINTENANCE RECOMMENDATIONS:  It is recommended that you get at least 30 minutes of aerobic exercise at least 5 days/week (for weight loss, you may need as much as 60-90 minutes). This can be any activity that gets your heart rate up. This can be divided in 10-15 minute intervals if needed, but try and build up your endurance at least once a week.  Weight bearing exercise is also recommended twice weekly.  Eat a healthy diet with lots of vegetables, fruits and fiber.  "Colorful" foods have a lot of vitamins (ie green vegetables, tomatoes, red peppers, etc).  Limit sweet tea, regular sodas and alcoholic beverages, all of which has a lot of calories and sugar.  Up to 1 alcoholic drink daily may be beneficial for women (unless trying to lose weight, watch sugars).  Drink a lot of water.  Calcium recommendations are 1200-1500 mg daily (1500 mg for postmenopausal women or women without ovaries), and vitamin D 1000 IU daily.  This should be obtained from diet and/or supplements (vitamins), and calcium should not be taken all at once, but in divided doses.  Monthly self breast exams and yearly mammograms for women over the age of 73 is recommended.  Sunscreen of at least SPF 30 should be used on all sun-exposed parts of the skin when outside between the hours of 10 am and 4 pm (not just when at beach or pool, but even with exercise, golf, tennis, and yard work!)  Use a sunscreen that says "broad spectrum" so it covers both UVA and UVB rays, and make sure to reapply every 1-2 hours.  Remember to change the batteries in your smoke detectors when changing your clock times in the spring and fall. Carbon monoxide detectors are recommended for your home.  Use your seat belt every time you are in a car, and please drive safely and not be distracted with cell phones and texting while driving.  You will be due for another bone density test this year (2 years from the last). Please schedule this to be done  when you schedule your next mammogram (due in October 2022).  [facility] should schedule it for you, and fax me the order to sign.  Please schedule for a routine eye exam.  Please get [place] copies of your living will and healthcare power of attorney at your convenience (so it can be scanned into your chart).

## 2020-08-20 ENCOUNTER — Encounter: Payer: Self-pay | Admitting: Family Medicine

## 2020-08-20 ENCOUNTER — Ambulatory Visit (INDEPENDENT_AMBULATORY_CARE_PROVIDER_SITE_OTHER): Payer: 59 | Admitting: Family Medicine

## 2020-08-20 ENCOUNTER — Other Ambulatory Visit: Payer: Self-pay

## 2020-08-20 VITALS — BP 128/62 | HR 68 | Ht 68.0 in | Wt 152.8 lb

## 2020-08-20 DIAGNOSIS — M8588 Other specified disorders of bone density and structure, other site: Secondary | ICD-10-CM

## 2020-08-20 DIAGNOSIS — Z0000 Encounter for general adult medical examination without abnormal findings: Secondary | ICD-10-CM | POA: Diagnosis not present

## 2020-08-20 DIAGNOSIS — E7800 Pure hypercholesterolemia, unspecified: Secondary | ICD-10-CM

## 2020-08-21 LAB — CBC WITH DIFFERENTIAL/PLATELET
Basophils Absolute: 0 10*3/uL (ref 0.0–0.2)
Basos: 1 %
EOS (ABSOLUTE): 0.2 10*3/uL (ref 0.0–0.4)
Eos: 4 %
Hematocrit: 41.4 % (ref 34.0–46.6)
Hemoglobin: 13.6 g/dL (ref 11.1–15.9)
Immature Grans (Abs): 0 10*3/uL (ref 0.0–0.1)
Immature Granulocytes: 0 %
Lymphocytes Absolute: 1.6 10*3/uL (ref 0.7–3.1)
Lymphs: 30 %
MCH: 29.1 pg (ref 26.6–33.0)
MCHC: 32.9 g/dL (ref 31.5–35.7)
MCV: 89 fL (ref 79–97)
Monocytes Absolute: 0.4 10*3/uL (ref 0.1–0.9)
Monocytes: 7 %
Neutrophils Absolute: 3.2 10*3/uL (ref 1.4–7.0)
Neutrophils: 58 %
Platelets: 196 10*3/uL (ref 150–450)
RBC: 4.68 x10E6/uL (ref 3.77–5.28)
RDW: 12.3 % (ref 11.7–15.4)
WBC: 5.4 10*3/uL (ref 3.4–10.8)

## 2020-08-21 LAB — COMPREHENSIVE METABOLIC PANEL
ALT: 18 IU/L (ref 0–32)
AST: 22 IU/L (ref 0–40)
Albumin/Globulin Ratio: 2.4 — ABNORMAL HIGH (ref 1.2–2.2)
Albumin: 5.3 g/dL — ABNORMAL HIGH (ref 3.8–4.8)
Alkaline Phosphatase: 85 IU/L (ref 44–121)
BUN/Creatinine Ratio: 23 (ref 12–28)
BUN: 21 mg/dL (ref 8–27)
Bilirubin Total: 0.4 mg/dL (ref 0.0–1.2)
CO2: 22 mmol/L (ref 20–29)
Calcium: 10.2 mg/dL (ref 8.7–10.3)
Chloride: 100 mmol/L (ref 96–106)
Creatinine, Ser: 0.91 mg/dL (ref 0.57–1.00)
Globulin, Total: 2.2 g/dL (ref 1.5–4.5)
Glucose: 83 mg/dL (ref 65–99)
Potassium: 4.7 mmol/L (ref 3.5–5.2)
Sodium: 138 mmol/L (ref 134–144)
Total Protein: 7.5 g/dL (ref 6.0–8.5)
eGFR: 69 mL/min/{1.73_m2} (ref >59)

## 2020-08-21 LAB — LIPID PANEL
Chol/HDL Ratio: 3.3 ratio (ref 0.0–4.4)
Cholesterol, Total: 238 mg/dL — ABNORMAL HIGH (ref 100–199)
HDL: 72 mg/dL (ref >39)
LDL Chol Calc (NIH): 157 mg/dL — ABNORMAL HIGH (ref 0–99)
Triglycerides: 57 mg/dL (ref 0–149)
VLDL Cholesterol Cal: 9 mg/dL (ref 5–40)

## 2020-08-21 LAB — VITAMIN D 25 HYDROXY (VIT D DEFICIENCY, FRACTURES): Vit D, 25-Hydroxy: 31.1 ng/mL (ref 30.0–100.0)

## 2020-10-17 LAB — HM MAMMOGRAPHY

## 2020-10-17 LAB — HM DEXA SCAN

## 2020-10-19 ENCOUNTER — Encounter: Payer: Self-pay | Admitting: Family Medicine

## 2020-10-20 ENCOUNTER — Encounter: Payer: Self-pay | Admitting: *Deleted

## 2020-10-27 ENCOUNTER — Encounter: Payer: Self-pay | Admitting: Family Medicine

## 2020-10-30 ENCOUNTER — Encounter: Payer: Self-pay | Admitting: Family Medicine

## 2020-12-08 ENCOUNTER — Encounter: Payer: Self-pay | Admitting: Family Medicine

## 2021-07-06 DIAGNOSIS — N281 Cyst of kidney, acquired: Secondary | ICD-10-CM | POA: Diagnosis not present

## 2021-07-06 DIAGNOSIS — N200 Calculus of kidney: Secondary | ICD-10-CM | POA: Diagnosis not present

## 2021-08-26 NOTE — Patient Instructions (Incomplete)
HEALTH MAINTENANCE RECOMMENDATIONS:  It is recommended that you get at least 30 minutes of aerobic exercise at least 5 days/week (for weight loss, you may need as much as 60-90 minutes). This can be any activity that gets your heart rate up. This can be divided in 10-15 minute intervals if needed, but try and build up your endurance at least once a week.  Weight bearing exercise is also recommended twice weekly.  Eat a healthy diet with lots of vegetables, fruits and fiber.  "Colorful" foods have a lot of vitamins (ie green vegetables, tomatoes, red peppers, etc).  Limit sweet tea, regular sodas and alcoholic beverages, all of which has a lot of calories and sugar.  Up to 1 alcoholic drink daily may be beneficial for women (unless trying to lose weight, watch sugars).  Drink a lot of water.  Calcium recommendations are 1200-1500 mg daily (1500 mg for postmenopausal women or women without ovaries), and vitamin D 1000 IU daily.  This should be obtained from diet and/or supplements (vitamins), and calcium should not be taken all at once, but in divided doses.  Monthly self breast exams and yearly mammograms for women over the age of 68 is recommended.  Sunscreen of at least SPF 30 should be used on all sun-exposed parts of the skin when outside between the hours of 10 am and 4 pm (not just when at beach or pool, but even with exercise, golf, tennis, and yard work!)  Use a sunscreen that says "broad spectrum" so it covers both UVA and UVB rays, and make sure to reapply every 1-2 hours.  Remember to change the batteries in your smoke detectors when changing your clock times in the spring and fall. Carbon monoxide detectors are recommended for your home.  Use your seat belt every time you are in a car, and please drive safely and not be distracted with cell phones and texting while driving.   Tasha Lee , Thank you for taking time to come for your Medicare Wellness Visit. I appreciate your ongoing  commitment to your health goals. Please review the following plan we discussed and let me know if I can assist you in the future.   This is a list of the screening recommended for you and due dates:  Health Maintenance  Topic Date Due   COVID-19 Vaccine (6 - Pfizer series) 04/05/2021   Flu Shot  08/11/2021   Colon Cancer Screening  10/03/2021   Mammogram  10/17/2021   Tetanus Vaccine  04/29/2027   Pneumonia Vaccine  Completed   DEXA scan (bone density measurement)  Completed   Hepatitis C Screening: USPSTF Recommendation to screen - Ages 12-79 yo.  Completed   Zoster (Shingles) Vaccine  Completed   HPV Vaccine  Aged Out   Please continue high dose flu shots in the Fall. I recommend getting the updated bivalent COVID booster when it is available (should be end of September). Consider the new RSV vaccine when it is available.  Pap smear was done today. If normal, you will not need these any longer.  You are due for colon cancer screening.  We discussed Cologuard vs repeat colonoscopy.  Since your last bone density test didn't show decline, and the thinning is mild, you can wait 3 years until your next bone density test.  Please schedule a routine eye exam.  Take a look at your calcium tablet to see how much vitamin D it contains.  If it has 800 IU, you should consider  taking a separate vitamin D3 1000 IU from October through March.  If it is less than 800 IU, I would just take the vitamin D3 every day (even in the warmer months). There is no harm to take the D3 daily, even if you get 800 IU from your calcium, if that is easier for you to remember.  It won't be too much.  Please bring [place] copies of your Living Will and Healthcare Power of Attorney once completed and notarized so that it can be scanned into your medical chart.

## 2021-08-26 NOTE — Progress Notes (Unsigned)
No chief complaint on file.   Tasha Lee is a 66 y.o. female who presents for Welcome to Medicare visit and follow-up on chronic problems.   She has the following concerns:  Hyperlipidemia: Cholesterol has been borderline, with good HDL.  She tries to follow lowfat, low cholesterol diet.  She continues to eat less red meat, more chicken, fish, ground [place], fruit and fresh vegetables from the garden. Due for recheck Lab Results  Component Value Date   CHOL 238 (H) 08/20/2020   HDL 72 08/20/2020   LDLCALC 157 (H) 08/20/2020   TRIG 57 08/20/2020   CHOLHDL 3.3 08/20/2020   History of kidney and ureteral stones, under care of [facility]. She was last seen in 06/2021.  She currently denies any problems.    Osteopenia:  She was found to have osteopenia on DEXA in 09/2018, T-1.7 at spine. F/u DEXA in 10/2020 was stable, T-1.6 at R fem neck, -1.5 at spine. She takes calcium +D supplement.   ?weight-bearing exercise?  Mother-in-law is in skilled nursing in [place] (since COVID pneumonia, prev in memory care; rough year, SDH, falls, fractures) Daughter and grandchildren moved to [place]. That was an adjustment, but she is doing well.  Enjoys gardening, sewing.  She is thinking about volunteering at the hospital. UPDATE   Immunization History  Administered Date(s) Administered   Influenza, High Dose Seasonal PF 10/01/2018, 10/12/2019, 10/19/2020   Influenza,inj,Quad PF,6+ Mos 11/02/2017   Influenza-Unspecified 11/02/2017   PFIZER(Purple Top)SARS-COV-2 Vaccination 02/17/2019, 03/14/2019, 10/12/2019, 08/03/2020   Pfizer Covid-19 Vaccine Bivalent Booster 27yrs & up 12/06/2020   Pneumococcal Conjugate-13 08/10/2018   Pneumococcal Polysaccharide-23 08/20/2019   Tdap 07/22/2011, 04/28/2017   Zoster Recombinat (Shingrix) 04/26/2016, 06/28/2016   Last Pap smear: 04/2016, normal (atrophy), no high risk HPV present Last mammogram: 10/2020 Last colonoscopy: 09/2011 normal; very  tortuous colon, needed to complete with Barium Enema; Dr. Evette Cristal at [facility]. Told 10 year follow-up Last DEXA: 10/2020 T-1.6 at R fem neck, T-1.5 spine (done at [facility]) Dentist: twice yearly Ophtho: past due Exercise:  walks 5x/week x 25-30 mins.  Push-mows yard once a week.  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 Grandchildren moved away.  Vitamin D-OH 31.1 in 08/2020   Patient Care Team: Joselyn Arrow, MD as PCP - General (Family Medicine) [facility]rologist:  [facility] (Dr. Mena Goes) Dentist: Dr. Alvester Morin Ophtho: plans to see [facility]htho (prev saw Dr. Elmer Picker) GI:  Dr. Evette Cristal  Depression Screening: Flowsheet Row Office Visit from 08/20/2020 in [facility]  PHQ-2 Total Score 0       Flowsheet Row Office Visit from 08/20/2019 in [facility]  PHQ-9 Total Score 0       Falls screen:     08/20/2020    9:34 AM 08/20/2019    9:57 AM 08/10/2018    2:49 PM  Fall Risk   Falls in the past year? 0 0 0  Number falls in past yr: 0    Injury with Fall? 0    Risk for fall due to : No Fall Risks    Follow up Falls evaluation completed       Functional Status Survey:        End of Life Discussion: Has a living will and healthcare power of attorney.   PMH, PSH, SH and FH reviewed and updated    ROS:  The patient denies anorexia, fever, headaches, vision changes, decreased hearing, ear pain, sore throat, breast concerns, chest pain, palpitations, dizziness, syncope, dyspnea on  exertion, cough, swelling, nausea, vomiting, diarrhea, constipation, abdominal pain, melena, hematochezia, indigestion/heartburn, urinary incontinence, hematuria, dysuria, vaginal bleeding, discharge, odor or itch, genital lesions, joint pains, numbness, tingling, weakness, tremor, suspicious skin lesions, depression, anxiety, abnormal bleeding/bruising, or enlarged lymph nodes.  Some dry eyes, worse in the summer, relieved by using Refresh    PHYSICAL EXAM:  There were no vitals  taken for this visit.  Wt Readings from Last 3 Encounters:  08/20/20 152 lb 12.8 oz (69.3 kg)  08/20/19 150 lb 6.4 oz (68.2 kg)  08/10/18 159 lb (72.1 kg)    General Appearance:    Alert, cooperative, no distress, appears stated age  Head:    Normocephalic, without obvious abnormality, atraumatic  Eyes:    PERRL, conjunctiva/corneas clear, EOM's intact, fundi benign  Ears:    Normal, no drainage or sinus tenderness  Nose:   Normal mucosa, no lesions  Throat:   Lips, mucosa, and tongue normal; teeth and gums normal  Neck:   Supple, no lymphadenopathy;  thyroid:  no enlargement/ tenderness/ nodules; no carotid bruit or JVD  Back:    Spine nontender, no CVA tenderness  Lungs:     Clear to auscultation bilaterally without wheezes, rales or ronchi; respirations unlabored  Chest Wall:    No tenderness or deformity   Heart:    Regular rate and rhythm, S1 and S2 normal, no murmur, rub or gallop  Breast Exam:    No tenderness, masses, or nipple discharge or inversion.  No axillary lymphadenopathy  Abdomen:     Soft, non-tender, nondistended, normoactive bowel sounds, no masses, no hepatosplenomegaly  Genitalia:    Normal external genitalia without lesions; atrophic changes noted.  BUS and vagina normal. No cervical motion tenderness or cervical lesions. No abnormal vaginal discharge.  Uterus and adnexa not enlarged, nontender, no masses. Pap performed  Rectal:    Normal tone, no masses or tenderness; guaiac negative stool.  Non-inflamed external hemorrhoids  Extremities:   No clubbing, cyanosis or edema  Pulses:   2+ and symmetric all extremities  Skin:   Skin color, texture, turgor normal, no rashes or lesions. 0.5 cm mole in upper mid back--central portion is slightly raised, edges are a little lighter in color and flat, unchanged. Many angioma on abdomen.  Lymph nodes:   Cervical, supraclavicular, inguinal and axillary nodes normal  Neurologic:   Normal strength, sensation and gait; reflexes 2+  and symmetric throughout                 Psych:  Normal mood, affect, hygiene and grooming.  ***update mole in upper mid back   ASSESSMENT/PLAN:  COMPUTER SAYS PRIMARY COVERAGE IS BLUE CROSS MEDICARE NOW?? I CHANGED TO WELCOME TO MEDICARE. ALSO HAS UHC WHICH IS NOT MEDICARE--DID HUSBAND RETIRE???  IF NOT, AND STILL ON UHC, THEN NOT WELCOME TO MEDICARE.  I SEE 2 CONFLICTING INSURANCES IN THE SYSTEM!!!!  Pap smear Cologuard vs colonoscopy  Discussed monthly self breast exams and yearly mammograms; at least 30 minutes of aerobic activity at least 5 days/week, weight-bearing exercise at least 2x/wk; proper sunscreen use reviewed; healthy diet, including goals of calcium and vitamin D intake and alcohol recommendations (less than or equal to 1 drink/day) reviewed; regular seatbelt use; changing batteries in smoke detectors. Immunization recommendations discussed--continue yearly high dose flu shots. COVID booster when available next month. Consider new RSV vaccine. Colon cancer screening recommendations reviewed--due now. Discussed Cologuard vs colonoscopy.     F/u 1 year, sooner prn.   Medicare  Attestation I have personally reviewed: The patient's medical and social history Their use of alcohol, tobacco or illicit drugs Their current medications and supplements The patient's functional ability including ADLs,fall risks, home safety risks, cognitive, and hearing and visual impairment Diet and physical activities Evidence for depression or mood disorders  The patient's weight, height, BMI have been recorded in the chart.  I have made referrals, counseling, and provided education to the patient based on review of the above and I have provided the patient with a written personalized care plan for preventive services.     Lavonda Jumbo, MD

## 2021-08-27 ENCOUNTER — Encounter: Payer: Self-pay | Admitting: Family Medicine

## 2021-08-27 ENCOUNTER — Other Ambulatory Visit (HOSPITAL_COMMUNITY)
Admission: RE | Admit: 2021-08-27 | Discharge: 2021-08-27 | Disposition: A | Discharge status: Home / Self Care | Payer: Medicare Other | Source: Ambulatory Visit | Attending: Family Medicine | Admitting: Family Medicine

## 2021-08-27 ENCOUNTER — Ambulatory Visit (INDEPENDENT_AMBULATORY_CARE_PROVIDER_SITE_OTHER): Payer: Medicare Other | Admitting: Family Medicine

## 2021-08-27 VITALS — BP 120/78 | HR 72 | Ht 67.25 in | Wt 156.2 lb

## 2021-08-27 DIAGNOSIS — Z1211 Encounter for screening for malignant neoplasm of colon: Secondary | ICD-10-CM | POA: Diagnosis not present

## 2021-08-27 DIAGNOSIS — Z124 Encounter for screening for malignant neoplasm of cervix: Secondary | ICD-10-CM

## 2021-08-27 DIAGNOSIS — Z0000 Encounter for general adult medical examination without abnormal findings: Secondary | ICD-10-CM | POA: Diagnosis not present

## 2021-08-27 DIAGNOSIS — E7800 Pure hypercholesterolemia, unspecified: Secondary | ICD-10-CM

## 2021-08-27 DIAGNOSIS — Z1151 Encounter for screening for human papillomavirus (HPV): Secondary | ICD-10-CM | POA: Insufficient documentation

## 2021-08-27 DIAGNOSIS — M8588 Other specified disorders of bone density and structure, other site: Secondary | ICD-10-CM

## 2021-08-28 ENCOUNTER — Encounter: Payer: Self-pay | Admitting: Family Medicine

## 2021-08-28 LAB — LIPID PANEL
Chol/HDL Ratio: 3.8 ratio (ref 0.0–4.4)
Cholesterol, Total: 260 mg/dL — ABNORMAL HIGH (ref 100–199)
HDL: 68 mg/dL (ref >39)
LDL Chol Calc (NIH): 181 mg/dL — ABNORMAL HIGH (ref 0–99)
Triglycerides: 68 mg/dL (ref 0–149)
VLDL Cholesterol Cal: 11 mg/dL (ref 5–40)

## 2021-08-31 ENCOUNTER — Other Ambulatory Visit: Payer: Self-pay

## 2021-08-31 DIAGNOSIS — E7800 Pure hypercholesterolemia, unspecified: Secondary | ICD-10-CM

## 2021-09-01 LAB — CYTOLOGY - PAP
Comment: NEGATIVE
Diagnosis: NEGATIVE
High risk HPV: NEGATIVE

## 2021-09-07 DIAGNOSIS — Z1211 Encounter for screening for malignant neoplasm of colon: Secondary | ICD-10-CM | POA: Diagnosis not present

## 2021-09-16 LAB — COLOGUARD: COLOGUARD: NEGATIVE

## 2021-10-19 ENCOUNTER — Encounter: Payer: Self-pay | Admitting: Family Medicine

## 2021-10-19 ENCOUNTER — Encounter: Payer: Self-pay | Admitting: *Deleted

## 2021-11-04 ENCOUNTER — Encounter: Payer: Self-pay | Admitting: Family Medicine

## 2021-11-12 DIAGNOSIS — Z1231 Encounter for screening mammogram for malignant neoplasm of breast: Secondary | ICD-10-CM | POA: Diagnosis not present

## 2021-11-23 ENCOUNTER — Other Ambulatory Visit: Payer: Medicare Other

## 2021-11-23 DIAGNOSIS — R928 Other abnormal and inconclusive findings on diagnostic imaging of breast: Secondary | ICD-10-CM | POA: Diagnosis not present

## 2021-11-23 DIAGNOSIS — E7800 Pure hypercholesterolemia, unspecified: Secondary | ICD-10-CM | POA: Diagnosis not present

## 2021-11-23 LAB — LIPID PANEL
Chol/HDL Ratio: 3.6 ratio (ref 0.0–4.4)
Cholesterol, Total: 225 mg/dL — ABNORMAL HIGH (ref 100–199)
HDL: 63 mg/dL (ref >39)
LDL Chol Calc (NIH): 148 mg/dL — ABNORMAL HIGH (ref 0–99)
Triglycerides: 78 mg/dL (ref 0–149)
VLDL Cholesterol Cal: 14 mg/dL (ref 5–40)

## 2021-11-23 LAB — HM MAMMOGRAPHY

## 2021-11-25 ENCOUNTER — Encounter: Payer: Self-pay | Admitting: *Deleted

## 2021-11-25 ENCOUNTER — Encounter: Payer: Self-pay | Admitting: Family Medicine

## 2022-08-13 DIAGNOSIS — N281 Cyst of kidney, acquired: Secondary | ICD-10-CM | POA: Diagnosis not present

## 2022-08-13 DIAGNOSIS — N200 Calculus of kidney: Secondary | ICD-10-CM | POA: Diagnosis not present

## 2022-09-20 ENCOUNTER — Encounter: Payer: Self-pay | Admitting: Family Medicine

## 2022-09-21 NOTE — Patient Instructions (Incomplete)
  Tasha Lee , Thank you for taking time to come for your Medicare Wellness Visit. I appreciate your ongoing commitment to your health goals. Please review the following plan we discussed and let me know if I can assist you in the future.   This is a list of the screening recommended for you and due dates:  Health Maintenance  Topic Date Due   Flu Shot  08/12/2022   Medicare Annual Wellness Visit  08/28/2022   COVID-19 Vaccine (7 - 2023-24 season) 09/12/2022   Mammogram  11/24/2022   Cologuard (Stool DNA test)  09/07/2024   DTaP/Tdap/Td vaccine (3 - Td or Tdap) 04/29/2027   Pneumonia Vaccine  Completed   DEXA scan (bone density measurement)  Completed   Hepatitis C Screening  Completed   Zoster (Shingles) Vaccine  Completed   HPV Vaccine  Aged Out   Colon Cancer Screening  Discontinued    Please bring [place] copies of your Living Will and Healthcare Power of Attorney so that it can be scanned into your medical chart.  We can give you your flu shot and COVID booster at your visit later this month, or you can get them from the pharmacy.  Please schedule a routine eye exam.

## 2022-09-21 NOTE — Progress Notes (Unsigned)
Start time: End time:  Virtual Visit via Video Note  I connected with Tasha Lee on 09/21/22 by a video enabled telemedicine application and verified that I am speaking with the correct person using two identifiers.  Location: Patient: *** Provider: office   I discussed the limitations of evaluation and management by telemedicine and the availability of in person appointments. The patient expressed understanding and agreed to proceed.  History of Present Illness:  No chief complaint on file.  Tasha Lee is a 67 y.o. female who presents for Medicare annual wellness visit. Her husband was recently diagnosed with Hodgkin's Lymphoma and is on chemo.  He hasn't been feeling well, and preferred not to come into the office today (also due to him being immunocompromised). She will be fasting for her CPE on 9/25 for her labs to be done the same day (rather than prior).  Immunization History  Administered Date(s) Administered   Influenza, High Dose Seasonal PF 10/01/2018, 10/12/2019, 10/19/2020   Influenza,inj,Quad PF,6+ Mos 11/02/2017   Influenza-Unspecified 11/02/2017, 10/19/2021   PFIZER(Purple Top)SARS-COV-2 Vaccination 02/17/2019, 03/14/2019, 10/12/2019, 08/03/2020   Pfizer Covid-19 Vaccine Bivalent Booster 25yrs & up 12/06/2020   Pneumococcal Conjugate-13 08/10/2018   Pneumococcal Polysaccharide-23 08/20/2019   Respiratory Syncytial Virus Vaccine,Recomb Aduvanted(Arexvy) 11/02/2021   Tdap 07/22/2011, 04/28/2017   Unspecified SARS-COV-2 Vaccination 10/19/2021   Zoster Recombinant(Shingrix) 04/26/2016, 06/28/2016    Last Pap smear: 04/2016, normal (atrophy), no high risk HPV present Last mammogram: 11/2021 Last colonoscopy: 09/2011 normal; very tortuous colon, needed to complete with Barium Enema; Dr. Evette Cristal at [place]. Negative Cologuard 08/2021 Last DEXA: 10/2020 T-1.6 at R fem neck, T-1.5 spine (done at [facility]) Dentist: twice yearly Ophtho: past due ***UPDATE Exercise:  walks 3x/week x 25-30 mins.  Push-mows yard once a week.  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  Vitamin D-OH 31.1 in 08/2020    Patient Care Team: Joselyn Arrow, MD as PCP - General (Family Medicine) Graylin Shiver, MD as Consulting Physician (Gastroenterology) Jerilee Field, MD as Consulting Physician (Urology) Dentist: Dr. Alvester Morin Ophtho: plans to see [facility] ophtho (prev saw Dr. Elmer Picker)   Depression Screening: Flowsheet Row Office Visit from 08/27/2021 in [facility]  PHQ-2 Total Score 0       Flowsheet Row Office Visit from 08/20/2019 in [facility]  PHQ-9 Total Score 0       Falls screen:     08/27/2021    9:32 AM 08/20/2020    9:34 AM 08/20/2019    9:57 AM 08/10/2018    2:49 PM  Fall Risk   Falls in the past year? 0 0 0 0  Number falls in past yr: 0 0    Injury with Fall? 0 0    Risk for fall due to : No Fall Risks No Fall Risks    Follow up Falls evaluation completed Falls evaluation completed       Functional Status Survey:            End of Life Discussion: Has a living will and healthcare power of attorney.  PMH, PSH, SH and FH reviewed and updated   ROS:  The patient denies anorexia, fever, headaches, vision changes, decreased hearing, ear pain, sore throat, breast concerns, chest pain, palpitations, dizziness, syncope, dyspnea on exertion, cough, swelling, nausea, vomiting, diarrhea, constipation, abdominal pain, melena, hematochezia, indigestion/heartburn, urinary incontinence, hematuria, dysuria, vaginal bleeding, discharge, odor or itch, genital lesions, joint pains, numbness, tingling, weakness, tremor, suspicious skin lesions, depression, anxiety, abnormal bleeding/bruising,  or enlarged lymph nodes.  Some dry eyes, worse in the summer, relieved by using Refresh      Observations/Objective:  There were no vitals taken for this visit.   Assessment and Plan:    Discussed monthly self breast exams and  yearly mammograms; at least 30 minutes of aerobic activity at least 5 days/week, weight-bearing exercise at least 2x/wk; proper sunscreen use reviewed; healthy diet, including goals of calcium and vitamin D intake and alcohol recommendations (less than or equal to 1 drink/day) reviewed; regular seatbelt use; changing batteries in smoke detectors. Immunization recommendations discussed--continue yearly high dose flu shots.  COVID booster   Colon cancer screening recommendations reviewed--UTD. Cologuard due again 08/2024. DEXA 3 years discussed (10/2023)   MOST form completed; full code, full care. Asked for copies of living will, healthcare POA.   Follow Up Instructions:    I discussed the assessment and treatment plan with the patient. The patient was provided an opportunity to ask questions and all were answered. The patient agreed with the plan and demonstrated an understanding of the instructions.   The patient was advised to call back or seek an in-person evaluation if the symptoms worsen or if the condition fails to improve as anticipated.  I spent *** minutes dedicated to the care of this patient, including pre-visit review of records, face to face time, post-visit ordering of testing and documentation.    Medicare Attestation I have personally reviewed: The patient's medical and social history Their use of alcohol, tobacco or illicit drugs Their current medications and supplements The patient's functional ability including ADLs,fall risks, home safety risks, cognitive, and hearing and visual impairment Diet and physical activities Evidence for depression or mood disorders  The patient's weight, height, BMI have been recorded in the chart.  I have made referrals, counseling, and provided education to the patient based on review of the above and I have provided the patient with a written personalized care plan for preventive services.     Lavonda Jumbo, MD

## 2022-09-22 ENCOUNTER — Encounter: Payer: Self-pay | Admitting: Family Medicine

## 2022-09-22 ENCOUNTER — Telehealth (INDEPENDENT_AMBULATORY_CARE_PROVIDER_SITE_OTHER): Payer: Medicare Other | Admitting: Family Medicine

## 2022-09-22 VITALS — BP 128/68 | HR 64 | Temp 97.3°F | Ht 68.0 in | Wt 155.0 lb

## 2022-09-22 DIAGNOSIS — Z0000 Encounter for general adult medical examination without abnormal findings: Secondary | ICD-10-CM | POA: Diagnosis not present

## 2022-09-22 DIAGNOSIS — M8588 Other specified disorders of bone density and structure, other site: Secondary | ICD-10-CM

## 2022-09-22 DIAGNOSIS — E7800 Pure hypercholesterolemia, unspecified: Secondary | ICD-10-CM

## 2022-10-04 NOTE — Progress Notes (Unsigned)
No chief complaint on file.   Tasha Lee is a 67 y.o. female who presents for annual physical exam and follow-up on chronic problems.  She had recent virtual AWV. She is fasting for labs today.   Hyperlipidemia: Cholesterol has been borderline, with good HDL.  She tries to follow lowfat, low cholesterol diet.  When LDL was up to 181 in 08/2021 we discussed medications, CT coronary scan, and dietary trial.  Her LDL improved to 148 on recheck.    She continues to eat less red meat, more chicken, fish, ground [place], fruit and fresh vegetables from the garden. Doesn't eat eggs regularly, not much cheese. Cooks with olive oil, uses vinaigrette dressings.  Due for recheck  Component Ref Range & Units 10 mo ago 1 yr ago 2 yr ago 3 yr ago 4 yr ago 5 yr ago 6 yr ago  Cholesterol, Total 100 - 199 mg/dL 829 High  562 High  130 High  229 High  227 High  225 High    Triglycerides 0 - 149 mg/dL 78 68 57 89 67 53 79 R  HDL >39 mg/dL 63 68 72 64 63 71 71 R  VLDL Cholesterol Cal 5 - 40 mg/dL 14 11 9 16 13 11    LDL Chol Calc (NIH) 0 - 99 mg/dL 865 High  784 High  696 High  149 High      Chol/HDL Ratio 0.0 - 4.4 ratio 3.6 3.8 CM 3.3 CM 3.6 CM 3.6 CM 3.2 CM 3.4 R    Osteopenia:  She was found to have osteopenia on DEXA in 09/2018, T-1.7 at spine. F/u DEXA in 10/2020 was stable, T-1.6 at R fem neck, -1.5 at spine. She takes calcium +D supplement.  Only walking, no other weight-bearing exercise.   Immunization History  Administered Date(s) Administered   Influenza, High Dose Seasonal PF 10/01/2018, 10/12/2019, 10/19/2020   Influenza,inj,Quad PF,6+ Mos 11/02/2017   Influenza-Unspecified 11/02/2017, 10/19/2021   PFIZER(Purple Top)SARS-COV-2 Vaccination 02/17/2019, 03/14/2019, 10/12/2019, 08/03/2020   Pfizer Covid-19 Vaccine Bivalent Booster 39yrs & up 12/06/2020   Pneumococcal Conjugate-13 08/10/2018   Pneumococcal Polysaccharide-23 08/20/2019   Respiratory Syncytial Virus Vaccine,Recomb  Aduvanted(Arexvy) 11/02/2021   Tdap 07/22/2011, 04/28/2017   Unspecified SARS-COV-2 Vaccination 10/19/2021   Zoster Recombinant(Shingrix) 04/26/2016, 06/28/2016   Last Pap smear: 08/2021, normal, inflammation and atrophy present, negative HR HPV Last mammogram: 11/2021 Last colonoscopy: 09/2011 normal; very tortuous colon, needed to complete with Barium Enema; Dr. Evette Cristal at [place]. Negative Cologuard 08/2021 Last DEXA: 10/2020 T-1.6 at R fem neck, T-1.5 spine (done at [facility]) Dentist: twice yearly Ophtho: past due, not for a few years.   Exercise: walks most days x 30 mins.  Push-mows yard once a week.  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   Vitamin D-OH 31.1 in 08/2020   Patient Care Team: Joselyn Arrow, MD as PCP - General (Family Medicine) Graylin Shiver, MD as Consulting Physician (Gastroenterology) Jerilee Field, MD as Consulting Physician (Urology) Dentist: Dr. Alvester Morin Ophtho: plans to see [facility] ophtho (prev saw Dr. Elmer Picker)    PMH, PSH, SH and FH reviewed and updated   ROS:  The patient denies anorexia, fever, headaches, vision changes, decreased hearing, ear pain, sore throat, breast concerns, chest pain, palpitations, dizziness, syncope, dyspnea on exertion, cough, swelling, nausea, vomiting, diarrhea, constipation, abdominal pain, melena, hematochezia, indigestion/heartburn, urinary incontinence, hematuria, dysuria, vaginal bleeding, discharge, odor or itch, genital lesions, joint pains, numbness, tingling, weakness, tremor, suspicious skin lesions, depression, anxiety, abnormal  bleeding/bruising, or enlarged lymph nodes.  Some dry eyes, worse in the summer, relieved by using Refresh    PHYSICAL EXAM:  There were no vitals taken for this visit.  Wt Readings from Last 3 Encounters:  09/22/22 155 lb (70.3 kg)  08/27/21 156 lb 3.2 oz (70.9 kg)  08/20/20 152 lb 12.8 oz (69.3 kg)    General Appearance:    Alert, cooperative, no  distress, appears stated age  Head:    Normocephalic, without obvious abnormality, atraumatic  Eyes:    PERRL, conjunctiva/corneas clear, EOM's intact, fundi benign  Ears:    Normal, no drainage or sinus tenderness  Nose:   Normal mucosa, no lesions  Throat:   Lips, mucosa, and tongue normal; teeth and gums normal  Neck:   Supple, no lymphadenopathy;  thyroid:  no enlargement/ tenderness/ nodules; no carotid bruit or JVD  Back:    Spine nontender, no CVA tenderness  Lungs:     Clear to auscultation bilaterally without wheezes, rales or ronchi; respirations unlabored  Chest Wall:    No tenderness or deformity   Heart:    Regular rate and rhythm, S1 and S2 normal, no murmur, rub or gallop  Breast Exam:    No tenderness, masses, or nipple discharge or inversion.  No axillary lymphadenopathy  Abdomen:     Soft, non-tender, nondistended, normoactive bowel sounds, no masses, no hepatosplenomegaly  Genitalia:    Normal external genitalia without lesions; atrophic changes noted.  BUS and vagina normal. No cervical motion tenderness or cervical lesions. Ectropion present inferiorly. No abnormal vaginal discharge.  Uterus and adnexa not enlarged, nontender, no masses. Pap performed  Rectal:    Normal tone, no masses or tenderness; no stool in vault for heme-testing. Non-inflamed external hemorrhoids  Extremities:   No clubbing, cyanosis or edema  Pulses:   2+ and symmetric all extremities  Skin:   Skin color, texture, turgor normal, no rashes or lesions. 0.5 cm mole in upper mid back--central portion is slightly raised, edges are a little lighter in color and flat, unchanged. Many angioma on abdomen.  Lymph nodes:   Cervical, supraclavicular, inguinal and axillary nodes normal  Neurologic:   Normal strength, sensation and gait; reflexes 2+ and symmetric throughout                 Psych:  Normal mood, affect, hygiene and grooming.   ASSESSMENT/PLAN:  COVID and flu shot Will need to draw clock  (camera  didn't work at [employer]) Needs pink MOST form completed (due to virtual AWV) We need copies of Living Will/HC POA   Discussed monthly self breast exams and yearly mammograms; at least 30 minutes of aerobic activity at least 5 days/week, weight-bearing exercise at least 2x/wk. Dietary calcium sources reviewed. Immunization recommendations discussed--continue yearly high dose flu shots, given today, along with COVID booster. Other vaccines are UTD> Colon cancer screening recommendations reviewed--UTD. Cologuard due again 08/2024. DEXA 3 years discussed (10/2023)    F/u 1 year, sooner prn.   Medicare Attestation I have personally reviewed: The patient's medical and social history Their use of alcohol, tobacco or illicit drugs Their current medications and supplements The patient's functional ability including ADLs,fall risks, home safety risks, cognitive, and hearing and visual impairment Diet and physical activities Evidence for depression or mood disorders  The patient's weight, height, BMI have been recorded in the chart.  I have made referrals, counseling, and provided education to the patient based on review of the above and I  have provided the patient with a written personalized care plan for preventive services.     Lavonda Jumbo, MD

## 2022-10-05 NOTE — Patient Instructions (Incomplete)
HEALTH MAINTENANCE RECOMMENDATIONS:  It is recommended that you get at least 30 minutes of aerobic exercise at least 5 days/week (for weight loss, you may need as much as 60-90 minutes). This can be any activity that gets your heart rate up. This can be divided in 10-15 minute intervals if needed, but try and build up your endurance at least once a week.  Weight bearing exercise is also recommended twice weekly.  Eat a healthy diet with lots of vegetables, fruits and fiber.  "Colorful" foods have a lot of vitamins (ie green vegetables, tomatoes, red peppers, etc).  Limit sweet tea, regular sodas and alcoholic beverages, all of which has a lot of calories and sugar.  Up to 1 alcoholic drink daily may be beneficial for women (unless trying to lose weight, watch sugars).  Drink a lot of water.  Calcium recommendations are 1200-1500 mg daily (1500 mg for postmenopausal women or women without ovaries), and vitamin D 1000 IU daily.  This should be obtained from diet and/or supplements (vitamins), and calcium should not be taken all at once, but in divided doses.  Monthly self breast exams and yearly mammograms for women over the age of 61 is recommended.  Sunscreen of at least SPF 30 should be used on all sun-exposed parts of the skin when outside between the hours of 10 am and 4 pm (not just when at beach or pool, but even with exercise, golf, tennis, and yard work!)  Use a sunscreen that says "broad spectrum" so it covers both UVA and UVB rays, and make sure to reapply every 1-2 hours.  Remember to change the batteries in your smoke detectors when changing your clock times in the spring and fall. Carbon monoxide detectors are recommended for your home.  Use your seat belt every time you are in a car, and please drive safely and not be distracted with cell phones and texting while driving.

## 2022-10-06 ENCOUNTER — Ambulatory Visit (INDEPENDENT_AMBULATORY_CARE_PROVIDER_SITE_OTHER): Payer: Medicare Other | Admitting: Family Medicine

## 2022-10-06 ENCOUNTER — Encounter: Payer: Self-pay | Admitting: Family Medicine

## 2022-10-06 VITALS — BP 128/70 | HR 76 | Ht 67.5 in | Wt 154.5 lb

## 2022-10-06 DIAGNOSIS — M8588 Other specified disorders of bone density and structure, other site: Secondary | ICD-10-CM

## 2022-10-06 DIAGNOSIS — Z87442 Personal history of urinary calculi: Secondary | ICD-10-CM | POA: Diagnosis not present

## 2022-10-06 DIAGNOSIS — E7800 Pure hypercholesterolemia, unspecified: Secondary | ICD-10-CM | POA: Diagnosis not present

## 2022-10-06 DIAGNOSIS — Z23 Encounter for immunization: Secondary | ICD-10-CM

## 2022-10-06 DIAGNOSIS — Z0000 Encounter for general adult medical examination without abnormal findings: Secondary | ICD-10-CM | POA: Diagnosis not present

## 2022-10-06 LAB — POCT URINALYSIS DIP (PROADVANTAGE DEVICE)
Bilirubin, UA: NEGATIVE
Blood, UA: NEGATIVE
Glucose, UA: NEGATIVE mg/dL
Ketones, POC UA: NEGATIVE mg/dL
Nitrite, UA: NEGATIVE
Protein Ur, POC: NEGATIVE mg/dL
Specific Gravity, Urine: 1.01
Urobilinogen, Ur: 0.2
pH, UA: 6 (ref 5.0–8.0)

## 2022-10-06 LAB — LIPID PANEL

## 2022-10-07 LAB — CMP14+EGFR
ALT: 21 IU/L (ref 0–32)
AST: 25 IU/L (ref 0–40)
Albumin: 4.5 g/dL (ref 3.9–4.9)
Alkaline Phosphatase: 83 IU/L (ref 44–121)
BUN/Creatinine Ratio: 23 (ref 12–28)
BUN: 21 mg/dL (ref 8–27)
Bilirubin Total: 0.4 mg/dL (ref 0.0–1.2)
CO2: 21 mmol/L (ref 20–29)
Calcium: 9.6 mg/dL (ref 8.7–10.3)
Chloride: 104 mmol/L (ref 96–106)
Creatinine, Ser: 0.93 mg/dL (ref 0.57–1.00)
Globulin, Total: 2.7 g/dL (ref 1.5–4.5)
Glucose: 90 mg/dL (ref 70–99)
Potassium: 4.2 mmol/L (ref 3.5–5.2)
Sodium: 141 mmol/L (ref 134–144)
Total Protein: 7.2 g/dL (ref 6.0–8.5)
eGFR: 67 mL/min/{1.73_m2} (ref >59)

## 2022-10-07 LAB — CBC WITH DIFFERENTIAL/PLATELET
Basophils Absolute: 0 10*3/uL (ref 0.0–0.2)
Basos: 1 %
EOS (ABSOLUTE): 0.1 10*3/uL (ref 0.0–0.4)
Eos: 2 %
Hematocrit: 40.3 % (ref 34.0–46.6)
Hemoglobin: 13.3 g/dL (ref 11.1–15.9)
Immature Grans (Abs): 0 10*3/uL (ref 0.0–0.1)
Immature Granulocytes: 0 %
Lymphocytes Absolute: 1.3 10*3/uL (ref 0.7–3.1)
Lymphs: 22 %
MCH: 29.9 pg (ref 26.6–33.0)
MCHC: 33 g/dL (ref 31.5–35.7)
MCV: 91 fL (ref 79–97)
Monocytes Absolute: 0.3 10*3/uL (ref 0.1–0.9)
Monocytes: 5 %
Neutrophils Absolute: 4.2 10*3/uL (ref 1.4–7.0)
Neutrophils: 70 %
Platelets: 221 10*3/uL (ref 150–450)
RBC: 4.45 x10E6/uL (ref 3.77–5.28)
RDW: 12.5 % (ref 11.7–15.4)
WBC: 6 10*3/uL (ref 3.4–10.8)

## 2022-10-07 LAB — LIPID PANEL
Cholesterol, Total: 232 mg/dL — ABNORMAL HIGH (ref 100–199)
HDL: 64 mg/dL (ref >39)
LDL CALC COMMENT:: 3.6 ratio (ref 0.0–4.4)
LDL Chol Calc (NIH): 156 mg/dL — ABNORMAL HIGH (ref 0–99)
Triglycerides: 69 mg/dL (ref 0–149)
VLDL Cholesterol Cal: 12 mg/dL (ref 5–40)

## 2022-11-18 DIAGNOSIS — Z1231 Encounter for screening mammogram for malignant neoplasm of breast: Secondary | ICD-10-CM | POA: Diagnosis not present

## 2022-11-18 LAB — HM MAMMOGRAPHY

## 2022-11-22 ENCOUNTER — Encounter: Payer: Self-pay | Admitting: *Deleted

## 2023-04-04 ENCOUNTER — Emergency Department (HOSPITAL_BASED_OUTPATIENT_CLINIC_OR_DEPARTMENT_OTHER)

## 2023-04-04 ENCOUNTER — Encounter (HOSPITAL_BASED_OUTPATIENT_CLINIC_OR_DEPARTMENT_OTHER): Payer: Self-pay

## 2023-04-04 ENCOUNTER — Other Ambulatory Visit: Payer: Self-pay

## 2023-04-04 ENCOUNTER — Emergency Department (HOSPITAL_BASED_OUTPATIENT_CLINIC_OR_DEPARTMENT_OTHER)
Admission: EM | Admit: 2023-04-04 | Discharge: 2023-04-04 | Disposition: A | Discharge status: Home / Self Care | Attending: Emergency Medicine | Admitting: Emergency Medicine

## 2023-04-04 DIAGNOSIS — N200 Calculus of kidney: Secondary | ICD-10-CM | POA: Diagnosis not present

## 2023-04-04 DIAGNOSIS — R109 Unspecified abdominal pain: Secondary | ICD-10-CM | POA: Diagnosis not present

## 2023-04-04 DIAGNOSIS — N132 Hydronephrosis with renal and ureteral calculous obstruction: Secondary | ICD-10-CM | POA: Insufficient documentation

## 2023-04-04 DIAGNOSIS — N281 Cyst of kidney, acquired: Secondary | ICD-10-CM | POA: Diagnosis not present

## 2023-04-04 DIAGNOSIS — N134 Hydroureter: Secondary | ICD-10-CM | POA: Diagnosis not present

## 2023-04-04 LAB — CBC
HCT: 39.1 % (ref 36.0–46.0)
Hemoglobin: 13.1 g/dL (ref 12.0–15.0)
MCH: 30 pg (ref 26.0–34.0)
MCHC: 33.5 g/dL (ref 30.0–36.0)
MCV: 89.5 fL (ref 80.0–100.0)
Platelets: 216 10*3/uL (ref 150–400)
RBC: 4.37 MIL/uL (ref 3.87–5.11)
RDW: 12.5 % (ref 11.5–15.5)
WBC: 10.2 10*3/uL (ref 4.0–10.5)
nRBC: 0 % (ref 0.0–0.2)

## 2023-04-04 LAB — COMPREHENSIVE METABOLIC PANEL
ALT: 16 U/L (ref 0–44)
AST: 19 U/L (ref 15–41)
Albumin: 4.4 g/dL (ref 3.5–5.0)
Alkaline Phosphatase: 67 U/L (ref 38–126)
Anion gap: 10 (ref 5–15)
BUN: 18 mg/dL (ref 8–23)
CO2: 27 mmol/L (ref 22–32)
Calcium: 9.5 mg/dL (ref 8.9–10.3)
Chloride: 101 mmol/L (ref 98–111)
Creatinine, Ser: 0.91 mg/dL (ref 0.44–1.00)
GFR, Estimated: >60 mL/min (ref >60)
Glucose, Bld: 105 mg/dL — ABNORMAL HIGH (ref 70–99)
Potassium: 3.8 mmol/L (ref 3.5–5.1)
Sodium: 138 mmol/L (ref 135–145)
Total Bilirubin: 0.4 mg/dL (ref 0.0–1.2)
Total Protein: 7.2 g/dL (ref 6.5–8.1)

## 2023-04-04 LAB — URINALYSIS, ROUTINE W REFLEX MICROSCOPIC
Bilirubin Urine: NEGATIVE
Glucose, UA: NEGATIVE mg/dL
Nitrite: NEGATIVE
RBC / HPF: >50 RBC/hpf (ref 0–5)
Specific Gravity, Urine: 1.023 (ref 1.005–1.030)
pH: 6.5 (ref 5.0–8.0)

## 2023-04-04 MED ORDER — ONDANSETRON HCL 4 MG/2ML IJ SOLN
4.0000 mg | Freq: Once | INTRAMUSCULAR | Status: AC
  Administered 2023-04-04: 4 mg via INTRAVENOUS
  Filled 2023-04-04: qty 2

## 2023-04-04 MED ORDER — HYDROMORPHONE HCL 1 MG/ML IJ SOLN
0.5000 mg | Freq: Once | INTRAMUSCULAR | Status: AC
  Administered 2023-04-04: 0.5 mg via INTRAVENOUS
  Filled 2023-04-04: qty 1

## 2023-04-04 MED ORDER — OXYCODONE HCL 5 MG PO TABS
5.0000 mg | ORAL_TABLET | ORAL | 0 refills | Status: DC | PRN
  Filled 2023-04-04 – 2023-04-05 (×2): qty 15, 3d supply, fill #0

## 2023-04-04 MED ORDER — OXYCODONE-ACETAMINOPHEN 5-325 MG PO TABS
1.0000 | ORAL_TABLET | Freq: Once | ORAL | Status: AC
  Administered 2023-04-04: 1 via ORAL
  Filled 2023-04-04: qty 1

## 2023-04-04 NOTE — ED Notes (Signed)
 Pt is resting at this time. Pt expresses no complains. Pt VSS. Pt bed is locked and at lowest position. Rounding performed.

## 2023-04-04 NOTE — ED Provider Notes (Signed)
 [place] EMERGENCY DEPARTMENT AT [facility] Provider Note   CSN: [id number] Arrival date & time: 04/04/23  1947     History {Add pertinent medical, surgical, social history, OB history to HPI:1} Chief Complaint  Patient presents with   Nephrolithiasis    Tasha Lee is a 68 y.o. female.  68 year old female with a history of kidney stones who presents emergency department left flank pain.  Patient reports that earlier today started experiencing colicky left-sided flank pain.  Now radiates into her left lower quadrant.  Feels similar to prior kidney stone.  Pain is currently 9/10 in severity.  Tried oxycodone as well as Flomax at home without relief of her symptoms.  Also did have an episode of nausea and vomiting.  Denies dysuria, frequency, fevers, or chills.  Did have to have lithotripsy 5 years ago with her prior kidney stone and that was with Dr. Mena Goes from [facility] urology.       Home Medications Prior to Admission medications   Medication Sig Start Date End Date Taking? Authorizing Provider  acetaminophen (TYLENOL) 500 MG tablet Take 500 mg by mouth as needed. Uses for HA's, but use is very rare. Patient not taking: Reported on 08/20/2019    [provider]  calcium citrate-vitamin D (CITRACAL+D) 315-200 MG-UNIT tablet Take 1 tablet by mouth daily.    [provider]      Allergies    Patient has no known allergies.    Review of Systems   Review of Systems  Physical Exam Updated Vital Signs BP (!) 150/71   Pulse (!) 55   Temp 98 F (36.7 C) (Oral)   Resp 18   Ht 5\' 9"  (1.753 m)   Wt 69.9 kg   SpO2 100%   BMI 22.74 kg/m  Physical Exam Vitals and nursing note reviewed.  Constitutional:      General: She is not in acute distress.    Appearance: She is well-developed.  HENT:     Head: Normocephalic and atraumatic.     Right Ear: External ear normal.     Left Ear: External ear normal.     Nose: Nose normal.  Eyes:      Extraocular Movements: Extraocular movements intact.     Conjunctiva/sclera: Conjunctivae normal.     Pupils: Pupils are equal, round, and reactive to light.  Pulmonary:     Effort: Pulmonary effort is normal. No respiratory distress.  Abdominal:     General: Abdomen is flat. There is no distension.     Palpations: Abdomen is soft. There is no mass.     Tenderness: There is no abdominal tenderness. There is no right CVA tenderness or guarding.  Musculoskeletal:     Cervical back: Normal range of motion and neck supple.  Skin:    General: Skin is warm and dry.  Neurological:     Mental Status: She is alert and oriented to person, place, and time. Mental status is at baseline.  Psychiatric:        Mood and Affect: Mood normal.     ED Results / Procedures / Treatments   Labs (all labs ordered are listed, but only abnormal results are displayed) Labs Reviewed  COMPREHENSIVE METABOLIC PANEL - Abnormal; Notable for the following components:      Result Value   Glucose, Bld 105 (*)    All other components within normal limits  URINALYSIS, ROUTINE W REFLEX MICROSCOPIC - Abnormal; Notable for the following components:   Hgb urine  dipstick LARGE (*)    Ketones, ur TRACE (*)    Protein, ur TRACE (*)    Leukocytes,Ua LARGE (*)    Bacteria, UA RARE (*)    All other components within normal limits  CBC    EKG None  Radiology No results found.  Procedures Procedures  {Document cardiac monitor, telemetry assessment procedure when appropriate:1}  Medications Ordered in ED Medications  ondansetron (ZOFRAN) injection 4 mg (4 mg Intravenous Given 04/04/23 2113)  HYDROmorphone (DILAUDID) injection 0.5 mg (0.5 mg Intravenous Given 04/04/23 2113)    ED Course/ Medical Decision Making/ A&P   {   Click here for ABCD2, HEART and other calculatorsREFRESH Note before signing :1}                              Medical Decision Making Amount and/or Complexity of Data Reviewed Labs:  ordered. Radiology: ordered.  Risk Prescription drug management.   ***  {Document critical care time when appropriate:1} {Document review of labs and clinical decision tools ie heart score, Chads2Vasc2 etc:1}  {Document your independent review of radiology images, and any outside records:1} {Document your discussion with family members, caretakers, and with consultants:1} {Document social determinants of health affecting pt's care:1} {Document your decision making why or why not admission, treatments were needed:1} Final Clinical Impression(s) / ED Diagnoses Final diagnoses:  None    Rx / DC Orders ED Discharge Orders     None

## 2023-04-04 NOTE — ED Triage Notes (Signed)
 Pt states pain started this afternoon, left flank coming around to the front +nausea

## 2023-04-04 NOTE — Discharge Instructions (Signed)
 You were seen for your kidney stone in the emergency department.    At home, please take Tylenol for your pain. You may also take the oxycodone we have prescribed you for any breakthrough pain that may have.  Do not take this before driving or operating heavy machinery.  Do not take this medication with alcohol.  Use the flowmax every day until the kidney stone passes. Please also strain your urine to collect the kidney stone. Store it in a container and take it to your urologist for analysis.   Follow-up with urology in a week to discuss your symptoms.   Return immediately to the emergency department if you experience any of the following: fever, unbearable pain, urinary retention, or any other concerning symptoms.    Thank you for visiting our Emergency Department. It was a pleasure taking care of you today.

## 2023-04-05 ENCOUNTER — Encounter (HOSPITAL_BASED_OUTPATIENT_CLINIC_OR_DEPARTMENT_OTHER): Payer: Self-pay

## 2023-04-05 ENCOUNTER — Emergency Department (HOSPITAL_BASED_OUTPATIENT_CLINIC_OR_DEPARTMENT_OTHER)
Admission: EM | Admit: 2023-04-05 | Discharge: 2023-04-05 | Disposition: A | Discharge status: Home / Self Care | Attending: Emergency Medicine | Admitting: Emergency Medicine

## 2023-04-05 ENCOUNTER — Other Ambulatory Visit: Payer: Self-pay

## 2023-04-05 ENCOUNTER — Other Ambulatory Visit (HOSPITAL_BASED_OUTPATIENT_CLINIC_OR_DEPARTMENT_OTHER): Payer: Self-pay

## 2023-04-05 DIAGNOSIS — D72829 Elevated white blood cell count, unspecified: Secondary | ICD-10-CM | POA: Insufficient documentation

## 2023-04-05 DIAGNOSIS — N200 Calculus of kidney: Secondary | ICD-10-CM | POA: Diagnosis not present

## 2023-04-05 DIAGNOSIS — R112 Nausea with vomiting, unspecified: Secondary | ICD-10-CM | POA: Diagnosis not present

## 2023-04-05 LAB — CBC WITH DIFFERENTIAL/PLATELET
Abs Immature Granulocytes: 0.04 10*3/uL (ref 0.00–0.07)
Basophils Absolute: 0 10*3/uL (ref 0.0–0.1)
Basophils Relative: 0 %
Eosinophils Absolute: 0 10*3/uL (ref 0.0–0.5)
Eosinophils Relative: 0 %
HCT: 40.4 % (ref 36.0–46.0)
Hemoglobin: 13.4 g/dL (ref 12.0–15.0)
Immature Granulocytes: 0 %
Lymphocytes Relative: 9 %
Lymphs Abs: 1.1 10*3/uL (ref 0.7–4.0)
MCH: 29.5 pg (ref 26.0–34.0)
MCHC: 33.2 g/dL (ref 30.0–36.0)
MCV: 89 fL (ref 80.0–100.0)
Monocytes Absolute: 0.8 10*3/uL (ref 0.1–1.0)
Monocytes Relative: 7 %
Neutro Abs: 9.6 10*3/uL — ABNORMAL HIGH (ref 1.7–7.7)
Neutrophils Relative %: 84 %
Platelets: 235 10*3/uL (ref 150–400)
RBC: 4.54 MIL/uL (ref 3.87–5.11)
RDW: 12.5 % (ref 11.5–15.5)
WBC: 11.5 10*3/uL — ABNORMAL HIGH (ref 4.0–10.5)
nRBC: 0 % (ref 0.0–0.2)

## 2023-04-05 LAB — BASIC METABOLIC PANEL
Anion gap: 11 (ref 5–15)
BUN: 18 mg/dL (ref 8–23)
CO2: 26 mmol/L (ref 22–32)
Calcium: 9.4 mg/dL (ref 8.9–10.3)
Chloride: 99 mmol/L (ref 98–111)
Creatinine, Ser: 1.31 mg/dL — ABNORMAL HIGH (ref 0.44–1.00)
GFR, Estimated: 44 mL/min — ABNORMAL LOW (ref >60)
Glucose, Bld: 128 mg/dL — ABNORMAL HIGH (ref 70–99)
Potassium: 4.1 mmol/L (ref 3.5–5.1)
Sodium: 136 mmol/L (ref 135–145)

## 2023-04-05 LAB — URINALYSIS, ROUTINE W REFLEX MICROSCOPIC
Bacteria, UA: NONE SEEN
Bilirubin Urine: NEGATIVE
Glucose, UA: NEGATIVE mg/dL
Leukocytes,Ua: NEGATIVE
Nitrite: NEGATIVE
RBC / HPF: >50 RBC/hpf (ref 0–5)
Specific Gravity, Urine: 1.028 (ref 1.005–1.030)
pH: 6.5 (ref 5.0–8.0)

## 2023-04-05 MED ORDER — HYDROMORPHONE HCL 1 MG/ML IJ SOLN
1.0000 mg | Freq: Once | INTRAMUSCULAR | Status: DC
  Filled 2023-04-05: qty 1

## 2023-04-05 MED ORDER — ONDANSETRON 4 MG PO TBDP
4.0000 mg | ORAL_TABLET | Freq: Three times a day (TID) | ORAL | 0 refills | Status: DC | PRN
  Filled 2023-04-05: qty 10, 4d supply, fill #0

## 2023-04-05 MED ORDER — SODIUM CHLORIDE 0.9 % IV BOLUS
500.0000 mL | Freq: Once | INTRAVENOUS | Status: AC
  Administered 2023-04-05: 500 mL via INTRAVENOUS

## 2023-04-05 MED ORDER — ONDANSETRON HCL 4 MG/2ML IJ SOLN
4.0000 mg | Freq: Once | INTRAMUSCULAR | Status: AC
  Administered 2023-04-05: 4 mg via INTRAVENOUS
  Filled 2023-04-05: qty 2

## 2023-04-05 NOTE — ED Triage Notes (Signed)
 Pt POV from home, was here last night and dx w/ kidney stone. Pt states she was up all night vomiting after d/c and came back due to ongoing nausea. Ambulatory, NAD, advises "very tired"

## 2023-04-05 NOTE — ED Provider Notes (Signed)
 [place] [facility]MENT AT [facility] Provider Note   CSN: [id number] Arrival date & time: 04/05/23  4098     History  Chief Complaint  Patient presents with   Nausea    Tasha Lee is a 68 y.o. female.  HPI   68 year old female with past medical history of kidney stones requiring lithotripsy, follows with Dr. Mena Goes with urology presents emergency department with ongoing nausea/vomiting and pain.  Was seen here last night with similar symptoms that started abruptly, diagnosed with left-sided 4 mm kidney stone and hydronephrosis.  Her symptoms were controlled and she was discharged home to try p.o. medication of which she did not tolerate and had profuse vomiting and worsening pain overnight.  Denies any fever but endorses chills.  Still having urinary frequency/dysuria.  Denies any chest pain or diarrhea.  Home Medications Prior to Admission medications   Medication Sig Start Date End Date Taking? Authorizing Provider  acetaminophen (TYLENOL) 500 MG tablet Take 500 mg by mouth as needed. Uses for HA's, but use is very rare. Patient not taking: Reported on 08/20/2019    [provider]  calcium citrate-vitamin D (CITRACAL+D) 315-200 MG-UNIT tablet Take 1 tablet by mouth daily.    [provider]  oxyCODONE (ROXICODONE) 5 MG immediate release tablet Take 1 tablet (5 mg total) by mouth every 4 (four) hours as needed. 04/04/23   Rondel Baton, MD      Allergies    Patient has no known allergies.    Review of Systems   Review of Systems  Constitutional:  Positive for chills. Negative for fever.  Respiratory:  Negative for shortness of breath.   Cardiovascular:  Negative for chest pain.  Gastrointestinal:  Positive for abdominal pain, nausea and vomiting. Negative for diarrhea.  Skin:  Negative for rash.  Neurological:  Negative for headaches.    Physical Exam Updated Vital Signs BP (!) 144/59 (BP Location: Right Arm)   Pulse 62    Resp 15   Ht 5\' 9"  (1.753 m)   Wt 69.9 kg   SpO2 100%   BMI 22.76 kg/m  Physical Exam Vitals and nursing note reviewed.  Constitutional:      General: She is not in acute distress.    Appearance: Normal appearance.  HENT:     Head: Normocephalic.     Mouth/Throat:     Mouth: Mucous membranes are moist.  Cardiovascular:     Rate and Rhythm: Normal rate.  Pulmonary:     Effort: Pulmonary effort is normal. No respiratory distress.  Abdominal:     General: There is no distension.     Palpations: Abdomen is soft.     Tenderness: There is abdominal tenderness. There is left CVA tenderness.     Comments: LLQ TTP  Skin:    General: Skin is warm.  Neurological:     Mental Status: She is alert and oriented to person, place, and time. Mental status is at baseline.  Psychiatric:        Mood and Affect: Mood normal.     ED Results / Procedures / Treatments   Labs (all labs ordered are listed, but only abnormal results are displayed) Labs Reviewed  CBC WITH DIFFERENTIAL/PLATELET  BASIC METABOLIC PANEL  URINALYSIS, ROUTINE W REFLEX MICROSCOPIC    EKG None  Radiology CT Renal Stone Study Result Date: 04/04/2023 CLINICAL DATA:  Flank pain with nausea EXAM: CT ABDOMEN AND PELVIS WITHOUT CONTRAST TECHNIQUE: Multidetector CT imaging of the abdomen and  pelvis was performed following the standard protocol without IV contrast. RADIATION DOSE REDUCTION: This exam was performed according to the departmental dose-optimization program which includes automated exposure control, adjustment of the mA and/or kV according to patient size and/or use of iterative reconstruction technique. COMPARISON:  Radiograph 03/16/2018, CT 10/26/2015, 01/13/2018 FINDINGS: Lower chest: Lung bases demonstrate no acute airspace disease. Hepatobiliary: No focal liver abnormality is seen. No gallstones, gallbladder wall thickening, or biliary dilatation. Pancreas: Unremarkable. No pancreatic ductal dilatation or  surrounding inflammatory changes. Spleen: Normal in size without focal abnormality. Adrenals/Urinary Tract: Adrenal glands are normal. Cyst in the mid to lower right kidney for which no imaging follow-up is recommended. Moderate severe left hydronephrosis and hydroureter, secondary to a 4 mm stone in the distal left ureter about 4 cm proximal to the left UVJ. Urinary bladder is unremarkable Stomach/Bowel: Stomach is within normal limits. Appendix appears normal. No evidence of bowel wall thickening, distention, or inflammatory changes. Vascular/Lymphatic: Aortic atherosclerosis. No enlarged abdominal or pelvic lymph nodes. Reproductive: Uterus and bilateral adnexa are unremarkable. Other: Negative for pelvic effusion or free air Musculoskeletal: No acute or suspicious osseous abnormality IMPRESSION: 1. Moderate to severe left hydronephrosis and hydroureter, secondary to a 4 mm stone in the distal left ureter about 4 cm proximal to the left UVJ. 2. Aortic atherosclerosis. Electronically Signed   By: Jasmine Pang M.D.   On: 04/04/2023 21:38    Procedures Procedures    Medications Ordered in ED Medications  ondansetron (ZOFRAN) injection 4 mg (has no administration in time range)  HYDROmorphone (DILAUDID) injection 1 mg (has no administration in time range)  sodium chloride 0.9 % bolus 500 mL (has no administration in time range)    ED Course/ Medical Decision Making/ A&P                                 Medical Decision Making Amount and/or Complexity of Data Reviewed Labs: ordered.  Risk Prescription drug management.   68 year old female presents emergency department with ongoing nausea/vomiting after being diagnosed with a left-sided kidney stone with moderate/severe hydronephrosis last night.  Was unable to tolerate oral medications.  Vitals are normal and stable on arrival.  She continues to have some mild left-sided groin pain.  Blood work is reassuring with only a mild leukocytosis,  creatinine is 1.31.  Urinalysis shows no infection.  After 1 dose of IV nausea medicine patient feels back to baseline, is tolerating p.o., pain is almost completely resolved.  Consulted with on-call urologist, Dr. Marlou Porch.  We reviewed her current results.  Will plan to send her home with Zofran and ODT and [facility] will contact the patient for outpatient clinic follow-up and repeat ultrasound if needed.  Patient feels well and is motivated to go home.  Patient at this time appears safe and stable for discharge and close outpatient follow up. Discharge plan and strict return to ED precautions discussed, patient verbalizes understanding and agreement.        Final Clinical Impression(s) / ED Diagnoses Final diagnoses:  None    Rx / DC Orders ED Discharge Orders     None         Rozelle Logan, DO 04/05/23 1610

## 2023-04-05 NOTE — Discharge Instructions (Signed)
 You have been seen and discharged from the emergency department.  You had mild dehydration on your blood work but otherwise reassuring workup.  Your visit was reviewed with on-call urologist, Dr. Marlou Porch.  He recommends that you stay very well-hydrated.  I have sent a prescription for Zofran dissolvable tabs to take as needed.  You may take the pain medicine as previously directed.  You may also take 1 tablet Flomax daily to aid in stone passing.  Triage nurse from [facility] should be contacting you to set you up with a clinic visit and repeat evaluation/ultrasound.  Take home medications as prescribed. If you have any worsening symptoms or further concerns for your health please return to an emergency department for further evaluation.

## 2023-04-11 DIAGNOSIS — N201 Calculus of ureter: Secondary | ICD-10-CM | POA: Diagnosis not present

## 2023-04-11 DIAGNOSIS — N281 Cyst of kidney, acquired: Secondary | ICD-10-CM | POA: Diagnosis not present

## 2023-04-11 DIAGNOSIS — N202 Calculus of kidney with calculus of ureter: Secondary | ICD-10-CM | POA: Diagnosis not present

## 2023-10-17 ENCOUNTER — Ambulatory Visit: Payer: Medicare Other | Admitting: Family Medicine

## 2023-10-30 NOTE — Patient Instructions (Incomplete)

## 2023-10-30 NOTE — Progress Notes (Unsigned)
 No chief complaint on file.  Tasha Lee is a 68 y.o. female who presents for annual physical exam and follow-up on chronic problems. She cancelled the AWV that was supposed to be done earlier in the month, along with fasting labs (which were to be discussed today). She is fasting for labs today.  H/o kidney stones:  She had recurrent kidney stone in 04/2023, causing severe L hydronephrosis.  She reportedly passed the stone on her own the following day, and had f/u with [facility] 4/1.  US  showed resolution of hydronephrosis, and urinalysis was normal (no blood).  ***UPDATE  Hyperlipidemia: Cholesterol has been borderline, with good HDL.  She tries to follow lowfat, low cholesterol diet.  When LDL was up to 181 in 08/2021 we discussed medications, CT coronary scan, and dietary trial.  Her LDL improved to 148 on recheck.  It was a little higher last year. She was recently noted to have aortic atherosclerosis, noted on CT in March 2025 done for kidney stones.  She had cut back significantly on red meat, eating more chicken, fish, ground [place], fruit and fresh vegetables from the garden. Doesn't eat eggs regularly, not much cheese. Cooks with olive oil, uses vinaigrette dressings. ***UPDATE DIET  Due for recheck  Component Ref Range & Units (hover) 1 yr ago (10/06/22) 1 yr ago (11/23/21) 2 yr ago (08/27/21) 3 yr ago (08/20/20) 4 yr ago (08/20/19) 5 yr ago (08/08/18) 6 yr ago (04/28/17)  Cholesterol, Total 232 High  225 High  260 High  238 High  229 High  227 High  225 High   Triglycerides 69 78 68 57 89 67 53  HDL 64 63 68 72 64 63 71  VLDL Cholesterol Cal 12 14 11 9 16 13 11   LDL Chol Calc (NIH) 156 High  148 High  181 High  157 High  149 High     Chol/HDL Ratio 3.6 3.6 CM 3.8 CM 3.3 CM 3.6 CM 3.6 CM 3.2 CM    Osteopenia:  She was found to have osteopenia on DEXA in 09/2018, T-1.7 at spine. F/u DEXA in 10/2020 was stable, T-1.6 at R fem neck, -1.5 at spine. She takes calcium +D  supplement.  Only walking, no other weight-bearing exercise. She has weights, not using them.   Immunization History  Administered Date(s) Administered   Fluad Trivalent(High Dose 65+) 10/06/2022   INFLUENZA, HIGH DOSE SEASONAL PF 10/01/2018, 10/12/2019, 10/19/2020   Influenza,inj,Quad PF,6+ Mos 11/02/2017   Influenza-Unspecified 11/02/2017, 10/19/2021   PFIZER(Purple Top)SARS-COV-2 Vaccination 02/17/2019, 03/14/2019, 10/12/2019, 08/03/2020   Pfizer Covid-19 Vaccine Bivalent Booster 21yrs & up 12/06/2020   Pfizer(Comirnaty)Fall Seasonal Vaccine 12 years and older 10/06/2022   Pneumococcal Conjugate-13 08/10/2018   Pneumococcal Polysaccharide-23 08/20/2019   Respiratory Syncytial Virus Vaccine,Recomb Aduvanted(Arexvy) 11/02/2021   Tdap 07/22/2011, 04/28/2017   Unspecified SARS-COV-2 Vaccination 10/19/2021   Zoster Recombinant(Shingrix ) 04/26/2016, 06/28/2016   Last Pap smear: 08/2021, normal, inflammation and atrophy present, negative HR HPV Last mammogram: 11/2022 Last colonoscopy: 09/2011 normal; very tortuous colon, needed to complete with Barium Enema; Dr. Ganem at [place]. Negative Cologuard 08/2021 Last DEXA: 10/2020 T-1.6 at R fem neck, T-1.5 spine (done at [facility]) Dentist: twice yearly Ophtho: past due, not for a few years.  *** Exercise:   walks most days x 30 mins.  Push-mows yard once a week.  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   Vitamin D -OH 31.1 in 08/2020    PMH, PSH, SH and FH reviewed and  updated    ROS:  The patient denies anorexia, fever, headaches, vision changes, decreased hearing, ear pain, sore throat, breast concerns, chest pain, palpitations, dizziness, syncope, dyspnea on exertion, cough, swelling, nausea, vomiting, diarrhea, constipation, abdominal pain, melena, hematochezia, indigestion/heartburn, urinary incontinence, hematuria, dysuria, vaginal bleeding, discharge, odor or itch, genital lesions, joint pains,  numbness, tingling, weakness, tremor, suspicious skin lesions, depression, anxiety, abnormal bleeding/bruising, or enlarged lymph nodes.  Some dry eyes, worse in the summer, relieved by using Refresh. Not bothering her currently.  ***urinary symptoms   PHYSICAL EXAM:  There were no vitals taken for this visit.  Wt Readings from Last 3 Encounters:  04/05/23 154 lb 1.6 oz (69.9 kg)  04/04/23 154 lb (69.9 kg)  10/06/22 154 lb 8 oz (70.1 kg)   10/06/22 154 lb 8 oz (70.1 kg)    General Appearance:    Alert, cooperative, no distress, appears stated age  Head:    Normocephalic, without obvious abnormality, atraumatic  Eyes:    PERRL, conjunctiva/corneas clear, EOM's intact, fundi benign  Ears:    Normal, no drainage or sinus tenderness  Nose:   Normal mucosa, no lesions  Throat:   Lips, mucosa, and tongue normal; teeth and gums normal  Neck:   Supple, no lymphadenopathy;  thyroid:  no enlargement/ tenderness/ nodules; no carotid bruit or JVD  Back:    Spine nontender, no CVA tenderness  Lungs:     Clear to auscultation bilaterally without wheezes, rales or ronchi; respirations unlabored  Chest Wall:    No tenderness or deformity   Heart:    Regular rate and rhythm, S1 and S2 normal, no murmur, rub or gallop  Breast Exam:    No tenderness, masses, or nipple discharge or inversion.  No axillary lymphadenopathy  Abdomen:     Soft, non-tender, nondistended, normoactive bowel sounds, no masses, no hepatosplenomegaly  Genitalia:    Normal external genitalia without lesions; atrophic changes noted.  BUS and vagina normal. No cervical motion tenderness or abnormal vaginal discharge.  Uterus and adnexa not enlarged, nontender, no masses. Pap not performed  Rectal:    Normal tone, no masses or tenderness. Non-inflamed external hemorrhoids, heme negative stool  Extremities:   No clubbing, cyanosis or edema  Pulses:   2+ and symmetric all extremities  Skin:   Skin color, texture, turgor normal, no  rashes or lesions. 0.5 cm mole in upper mid back--central portion is slightly raised, unchanged. Many angiomas throughout  Lymph nodes:   Cervical, supraclavicular, inguinal and axillary nodes normal  Neurologic:   Normal strength, sensation and gait; reflexes 2+ and symmetric throughout                 Psych:  Normal mood, affect, hygiene and grooming.  ***UPDATE skin on back.   ASSESSMENT/PLAN:  Patient cancelled her fasting AWV exam earlier in the month. 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 Only needs fall/depression screen today, not the rest of the medicare stuff--that's for AWV  Flu and COVID vaccines Prevnar-20 also rec (not today if getting the other 2 vaccines)  DEXA is due at [facility]--if she doesn't already have this scheduled, please fax over order, f/u osteopenia.    Needs statin due to aortic atherosclerosis.  Can also do calcium score, if pt desires, but statin needed regardless.  Needs to r/s AWV--can be virtual and 30 min if done within the next couple of weeks. Should be in person and 45 mins if going to  be done in 3 mos to also f/u on lipids   Discussed monthly self breast exams and yearly mammograms; at least 30 minutes of aerobic activity at least 5 days/week, weight-bearing exercise at least 2x/wk. Immunization recommendations discussed--continue yearly high dose flu shots, given today, along with COVID booster.  Prevnar-20 was recommended. *** Discussed proper use of sunscreen, safe/undistracted driving, changing batteries/replacing smoke detectors, carbon monoxide detectors in home. Colon cancer screening recommendations reviewed--UTD. Cologuard due again 08/2024. DEXA due now.   F/u 3 mos--lipids ??part of AWV or labs prior to a virtual AWV ***  R/s AWV 1 year for CPE Reminded to get us  copies of her Living Will/HC POA

## 2023-10-31 ENCOUNTER — Encounter: Payer: Self-pay | Admitting: Family Medicine

## 2023-10-31 ENCOUNTER — Ambulatory Visit: Payer: Medicare Other | Admitting: Family Medicine

## 2023-10-31 VITALS — BP 136/74 | HR 72 | Ht 69.0 in | Wt 161.4 lb

## 2023-10-31 DIAGNOSIS — Z23 Encounter for immunization: Secondary | ICD-10-CM | POA: Diagnosis not present

## 2023-10-31 DIAGNOSIS — Z5181 Encounter for therapeutic drug level monitoring: Secondary | ICD-10-CM

## 2023-10-31 DIAGNOSIS — Z0000 Encounter for general adult medical examination without abnormal findings: Secondary | ICD-10-CM | POA: Diagnosis not present

## 2023-10-31 DIAGNOSIS — E559 Vitamin D deficiency, unspecified: Secondary | ICD-10-CM | POA: Diagnosis not present

## 2023-10-31 DIAGNOSIS — M8588 Other specified disorders of bone density and structure, other site: Secondary | ICD-10-CM | POA: Diagnosis not present

## 2023-10-31 DIAGNOSIS — E7800 Pure hypercholesterolemia, unspecified: Secondary | ICD-10-CM

## 2023-10-31 DIAGNOSIS — I700 Atherosclerosis of aorta: Secondary | ICD-10-CM

## 2023-10-31 DIAGNOSIS — Z87442 Personal history of urinary calculi: Secondary | ICD-10-CM

## 2023-10-31 LAB — LIPID PANEL

## 2023-11-01 ENCOUNTER — Other Ambulatory Visit (HOSPITAL_BASED_OUTPATIENT_CLINIC_OR_DEPARTMENT_OTHER): Payer: Self-pay

## 2023-11-01 ENCOUNTER — Ambulatory Visit: Payer: Self-pay | Admitting: Family Medicine

## 2023-11-01 DIAGNOSIS — M8589 Other specified disorders of bone density and structure, multiple sites: Secondary | ICD-10-CM | POA: Diagnosis not present

## 2023-11-01 LAB — CBC WITH DIFFERENTIAL/PLATELET
Basophils Absolute: 0 x10E3/uL (ref 0.0–0.2)
Basos: 1 %
EOS (ABSOLUTE): 0.1 x10E3/uL (ref 0.0–0.4)
Eos: 2 %
Hematocrit: 40.8 % (ref 34.0–46.6)
Hemoglobin: 13.6 g/dL (ref 11.1–15.9)
Immature Grans (Abs): 0 x10E3/uL (ref 0.0–0.1)
Immature Granulocytes: 0 %
Lymphocytes Absolute: 1.2 x10E3/uL (ref 0.7–3.1)
Lymphs: 24 %
MCH: 29.9 pg (ref 26.6–33.0)
MCHC: 33.3 g/dL (ref 31.5–35.7)
MCV: 90 fL (ref 79–97)
Monocytes Absolute: 0.3 x10E3/uL (ref 0.1–0.9)
Monocytes: 5 %
Neutrophils Absolute: 3.6 x10E3/uL (ref 1.4–7.0)
Neutrophils: 68 %
Platelets: 233 x10E3/uL (ref 150–450)
RBC: 4.55 x10E6/uL (ref 3.77–5.28)
RDW: 12.4 % (ref 11.7–15.4)
WBC: 5.3 x10E3/uL (ref 3.4–10.8)

## 2023-11-01 LAB — COMPREHENSIVE METABOLIC PANEL WITH GFR
ALT: 17 IU/L (ref 0–32)
AST: 25 IU/L (ref 0–40)
Albumin: 4.3 g/dL (ref 3.9–4.9)
Alkaline Phosphatase: 83 IU/L (ref 49–135)
BUN/Creatinine Ratio: 17 (ref 12–28)
BUN: 16 mg/dL (ref 8–27)
Bilirubin Total: 0.4 mg/dL (ref 0.0–1.2)
CO2: 21 mmol/L (ref 20–29)
Calcium: 9.6 mg/dL (ref 8.7–10.3)
Chloride: 105 mmol/L (ref 96–106)
Creatinine, Ser: 0.95 mg/dL (ref 0.57–1.00)
Globulin, Total: 2.5 g/dL (ref 1.5–4.5)
Glucose: 93 mg/dL (ref 70–99)
Potassium: 4.1 mmol/L (ref 3.5–5.2)
Sodium: 140 mmol/L (ref 134–144)
Total Protein: 6.8 g/dL (ref 6.0–8.5)
eGFR: 64 mL/min/1.73 (ref >59)

## 2023-11-01 LAB — VITAMIN D 25 HYDROXY (VIT D DEFICIENCY, FRACTURES): Vit D, 25-Hydroxy: 33.5 ng/mL (ref 30.0–100.0)

## 2023-11-01 LAB — LIPID PANEL
Cholesterol, Total: 242 mg/dL — ABNORMAL HIGH (ref 100–199)
HDL: 67 mg/dL (ref >39)
LDL CALC COMMENT:: 3.6 ratio (ref 0.0–4.4)
LDL Chol Calc (NIH): 161 mg/dL — AB (ref 0–99)
Triglycerides: 84 mg/dL (ref 0–149)
VLDL Cholesterol Cal: 14 mg/dL (ref 5–40)

## 2023-11-01 LAB — HM DEXA SCAN

## 2023-11-01 MED ORDER — ROSUVASTATIN CALCIUM 20 MG PO TABS
20.0000 mg | ORAL_TABLET | Freq: Every day | ORAL | 3 refills | Status: DC
  Filled 2023-11-01: qty 30, 30d supply, fill #0
  Filled 2023-11-26: qty 30, 30d supply, fill #1
  Filled 2023-12-26: qty 30, 30d supply, fill #2
  Filled 2024-01-25: qty 30, 30d supply, fill #3

## 2023-11-07 ENCOUNTER — Ambulatory Visit: Payer: Self-pay | Admitting: Family Medicine

## 2023-11-24 DIAGNOSIS — Z1231 Encounter for screening mammogram for malignant neoplasm of breast: Secondary | ICD-10-CM | POA: Diagnosis not present

## 2023-11-24 LAB — HM MAMMOGRAPHY

## 2024-01-31 ENCOUNTER — Ambulatory Visit

## 2024-02-02 ENCOUNTER — Other Ambulatory Visit

## 2024-02-02 DIAGNOSIS — E7800 Pure hypercholesterolemia, unspecified: Secondary | ICD-10-CM

## 2024-02-02 DIAGNOSIS — Z5181 Encounter for therapeutic drug level monitoring: Secondary | ICD-10-CM

## 2024-02-03 LAB — HEPATIC FUNCTION PANEL
ALT: 42 IU/L — ABNORMAL HIGH (ref 0–32)
AST: 42 IU/L — ABNORMAL HIGH (ref 0–40)
Albumin: 4.6 g/dL (ref 3.9–4.9)
Alkaline Phosphatase: 102 IU/L (ref 49–135)
Bilirubin Total: 0.6 mg/dL (ref 0.0–1.2)
Bilirubin, Direct: 0.16 mg/dL (ref 0.00–0.40)
Total Protein: 7 g/dL (ref 6.0–8.5)

## 2024-02-03 LAB — LIPID PANEL
Chol/HDL Ratio: 2.3 ratio (ref 0.0–4.4)
Cholesterol, Total: 152 mg/dL (ref 100–199)
HDL: 67 mg/dL
LDL Chol Calc (NIH): 71 mg/dL (ref 0–99)
Triglycerides: 71 mg/dL (ref 0–149)
VLDL Cholesterol Cal: 14 mg/dL (ref 5–40)

## 2024-02-06 ENCOUNTER — Other Ambulatory Visit: Payer: Self-pay

## 2024-02-07 NOTE — Progress Notes (Unsigned)
 "  No chief complaint on file.    Subjective:   Tasha Lee is a 69 y.o. female who presents for a Medicare Annual Wellness Visit.  Fall Screening Falls in the past year?: 0 Number of falls in past year: 0 Was there an injury with Fall?: 0 Fall Risk Category Calculator: 0 Patient Fall Risk Level: Low Fall Risk  Fall Risk Patient at Risk for Falls Due to: No Fall Risks Fall risk Follow up: Falls evaluation completed  Advance Directives (For Healthcare) Does Patient Have a Medical Advance Directive?: No Would patient like information on creating a medical advance directive?: No - Guardian declined    Allergies (verified) Patient has no known allergies.   Current Medications (verified) Outpatient Encounter Medications as of 02/08/2024  Medication Sig   acetaminophen  (TYLENOL ) 500 MG tablet Take 500 mg by mouth as needed. Uses for HA's, but use is very rare.   calcium  citrate-vitamin D  (CITRACAL+D) 315-200 MG-UNIT tablet Take 1 tablet by mouth daily.   rosuvastatin  (CRESTOR ) 20 MG tablet Take 1 tablet (20 mg total) by mouth daily.   No facility-administered encounter medications on file as of 02/08/2024.    History: Past Medical History:  Diagnosis Date   Chronic kidney disease 10/2015   Kidney stones   History of kidney stones    Left ureteral stone 10/2015   Past Surgical History:  Procedure Laterality Date   COLONOSCOPY  2013   Tortuous colon, finished with barium enema   EXTRACORPOREAL SHOCK WAVE LITHOTRIPSY Left 01/19/2018   Procedure: LEFT EXTRACORPOREAL SHOCK WAVE LITHOTRIPSY (ESWL);  Surgeon: Devere Lonni Righter, MD;  Location: [facility];  Service: Urology;  Laterality: Left;   EXTRACORPOREAL SHOCK WAVE LITHOTRIPSY Left 03/16/2018   Procedure: EXTRACORPOREAL SHOCK WAVE LITHOTRIPSY (ESWL);  Surgeon: Gaston Hamilton, MD;  Location: [facility];  Service: Urology;  Laterality: Left;   LITHOTRIPSY Left 11/2015   Family History  Problem Relation Age of Onset    Cancer Mother 84       breast   Hypertension Mother    Dementia Mother    Kidney failure Mother    Heart disease Mother        CHF   Arthritis Mother    Heart disease Father        first MI in 97's; CABG 61   Dementia Father    Diabetes Father    Cancer Brother        prostate (65s), NonHodgkin's lymphome (60), leukemia (41)   Leukemia Brother    Lymphoma Brother    Prostate cancer Brother    Asthma Daughter    Social History   Occupational History   Occupation: homemaker (former runner, broadcasting/film/video at [employer])  Tobacco Use   Smoking status: Never   Smokeless tobacco: Never  Vaping Use   Vaping status: Never Used  Substance and Sexual Activity   Alcohol use: No   Drug use: No   Sexual activity: Yes    Partners: Male   Tobacco Counseling Counseling given: Not Answered  SDOH Screenings   Food Insecurity: No Food Insecurity (10/27/2023)  Housing: Low Risk (10/27/2023)  Transportation Needs: No Transportation Needs (10/27/2023)  Utilities: Not At Risk (10/06/2022)  Depression (PHQ2-9): Low Risk (10/31/2023)  Financial Resource Strain: Low Risk (10/27/2023)  Physical Activity: Sufficiently Active (10/27/2023)  Social Connections: Socially Integrated (10/27/2023)  Stress: No Stress Concern Present (10/27/2023)  Tobacco Use: Low Risk (10/31/2023)   See flowsheets for full screening details  Depression Screen PHQ 2 &  9 Depression Scale- Over the past 2 weeks, how often have you been bothered by any of the following problems? Little interest or pleasure in doing things: 0 Feeling down, depressed, or hopeless (PHQ Adolescent also includes...irritable): 0 PHQ-2 Total Score: 0     Goals Addressed   None          Objective:    There were no vitals filed for this visit. There is no height or weight on file to calculate BMI.  Hearing/Vision screen No results found. Immunizations and Health Maintenance Health Maintenance  Topic Date Due   COVID-19 Vaccine (8 -  2025-26 season) 09/12/2023   Medicare Annual Wellness (AWV)  09/22/2023   Fecal DNA (Cologuard)  09/07/2024   Mammogram  11/23/2024   DTaP/Tdap/Td (3 - Td or Tdap) 04/29/2027   Pneumococcal Vaccine: 50+ Years  Completed   Influenza Vaccine  Completed   Bone Density Scan  Completed   Hepatitis C Screening  Completed   Zoster Vaccines- Shingrix   Completed   Meningococcal B Vaccine  Aged Out   Colonoscopy  Discontinued        Assessment/Plan:  This is a routine wellness examination for Tasha Lee.  Patient Care Team: Randol Dawes, MD as PCP - General (Family Medicine) Lennard Lesta FALCON, MD as Consulting Physician (Gastroenterology) Nieves Cough, MD as Consulting Physician (Urology) Dentist: Dr. Carolee Ophtho: plans to see [facility] ophtho (prev saw Dr. Cleatus) Would switch to [facility] if future GI needed (rather than going back to [facility])   I have personally reviewed and noted the following in the patients chart:   Medical and social history Use of alcohol, tobacco or illicit drugs  Current medications and supplements including opioid prescriptions. Functional ability and status Nutritional status Physical activity Advanced directives List of other physicians Hospitalizations, surgeries, and ER visits in previous 12 months Vitals Screenings to include cognitive, depression, and falls Referrals and appointments  No orders of the defined types were placed in this encounter.  In addition, I have reviewed and discussed with patient certain preventive protocols, quality metrics, and best practice recommendations. A written personalized care plan for preventive services as well as general preventive health recommendations were provided to patient.   Lucienne FALCON Fuse, RMA   02/07/2024   No follow-ups on file.  After Visit Summary: (In Person-Printed) AVS printed and given to the patient  Nurse Notes: none "

## 2024-02-07 NOTE — Progress Notes (Unsigned)
 No chief complaint on file.  Patient presents for Medicare annual wellness visit (see separate documentation) and 3 month follow-up.  Hyperlipidemia: Cholesterol has been borderline (148-161, max of 181 in 08/2021), with good HDL and chol/HDL ratio.  She tries to follow lowfat, low cholesterol diet.  She was noted to have aortic atherosclerosis on CT done in March 2025 (for kidney stones). We discussed atherosclerosis and recommendation to start a statin at her physical in October. She has been taking rosuvastatin  20 mg and denies side effects. She continues to follow a low cholesterol diet (eating red meat 1-2x/week, having more chicken, fish, ground turkey, eating more fruits and vegetables from the garden; no regular egg intake,not much cheese. She cooks with olive oil, not butter, uses vinaigrette dressings).  Recheck on rosuvastatin  was at goal: Component Ref Range & Units (hover) 5 d ago (02/02/24) 3 mo ago (10/31/23) 1 yr ago (10/06/22) 2 yr ago (11/23/21) 2 yr ago (08/27/21) 3 yr ago (08/20/20) 4 yr ago (08/20/19)  Cholesterol, Total 152 242 High  232 High  225 High  260 High  238 High  229 High   Triglycerides 71 84 69 78 68 57 89  HDL 67 67 64 63 68 72 64  VLDL Cholesterol Cal 14 14 12 14 11 9 16   LDL Chol Calc (NIH) 71 161 High  156 High  148 High  181 High  157 High  149 High   Chol/HDL Ratio 2.3 3.6 CM 3.6 CM 3.6 CM 3.8 CM 3.3 CM 3.6 CM   Liver enzymes were noted to be mildly elevated on the statin (42, normal <40); all LFT's previously have been normal. She does not drink alcohol  ***tylenol ??   Lab Results  Component Value Date   ALT 42 (H) 02/02/2024   AST 42 (H) 02/02/2024   ALKPHOS 102 02/02/2024   BILITOT 0.6 02/02/2024    Osteopenia:  Vitamin D  level was checked in October, and was found to be 33, lower end of normal.  She was encouraged to take an extra 1000 IU of D3 (or adding a MVI daily), as the level was likely going to drop low over the winter. She is  currently taking ***   PMH, PSH, SH reviewed   ROS:    PHYSICAL EXAM:  There were no vitals taken for this visit.  Wt Readings from Last 3 Encounters:  10/31/23 161 lb 6.4 oz (73.2 kg)  04/05/23 154 lb 1.6 oz (69.9 kg)  04/04/23 154 lb (69.9 kg)        ASSESSMENT/PLAN:   Add if taking separate D3 or MVI (was advised to start based on vitamin D  level in October)  LDL dropped 90 pts with 20 mg rosuvastatin . Goal LDL <100. Given elevated LFT's, let's lower the rosuvastatin  dose to 10mg  and recheck lipids and LFTs in 3 mos  3 mo fasting lab visit Future orders for lipids and LFTs in 3 mos Rx rosuvastatin  10 mg #90, no refill

## 2024-02-07 NOTE — Patient Instructions (Incomplete)
" °  Ms. Tasha Lee , Thank you for taking time to come for your Medicare Wellness Visit. I appreciate your ongoing commitment to your health goals. Please review the following plan we discussed and let me know if I can assist you in the future.   This is a list of the screening recommended for you and due dates:  Health Maintenance  Topic Date Due   Medicare Annual Wellness Visit  09/22/2023   COVID-19 Vaccine (8 - 2025-26 season) 02/23/2024*   Cologuard (Stool DNA test)  09/07/2024   Breast Cancer Screening  11/23/2024   DTaP/Tdap/Td vaccine (3 - Td or Tdap) 04/29/2027   Pneumococcal Vaccine for age over 34  Completed   Flu Shot  Completed   Osteoporosis screening with Bone Density Scan  Completed   Hepatitis C Screening  Completed   Zoster (Shingles) Vaccine  Completed   Meningitis B Vaccine  Aged Out   Colon Cancer Screening  Discontinued  *Topic was postponed. The date shown is not the original due date.    Your had an excellent response to the rosuvastatin , lowering the LDL more than it needed to, with a very slight bump in the liver enzymes. We are going to lower the rosuvastatin  to 10 mg, and recheck the cholesterol and liver enzymes in 3 months. Hopefully everything will look perfect. Continue to follow a low cholesterol diet.  I recommend adding 1000 IU of vitamin D3 (vs taking a multivitamin with the same amount) in order to keep the vitamin D  at a better level (it was borderline low 3 months ago, and possibly drops further through the winter).    Please bring us  copies of your Living Will and Healthcare Power of Attorney at your convenience, so that it can be scanned into your medical chart.  "

## 2024-02-08 ENCOUNTER — Encounter: Payer: Self-pay | Admitting: Family Medicine

## 2024-02-08 ENCOUNTER — Other Ambulatory Visit (HOSPITAL_BASED_OUTPATIENT_CLINIC_OR_DEPARTMENT_OTHER): Payer: Self-pay

## 2024-02-08 ENCOUNTER — Ambulatory Visit: Payer: Self-pay | Admitting: Family Medicine

## 2024-02-08 VITALS — BP 128/72 | HR 76 | Ht 67.0 in | Wt 162.8 lb

## 2024-02-08 DIAGNOSIS — Z5181 Encounter for therapeutic drug level monitoring: Secondary | ICD-10-CM

## 2024-02-08 DIAGNOSIS — Z0000 Encounter for general adult medical examination without abnormal findings: Secondary | ICD-10-CM

## 2024-02-08 DIAGNOSIS — R7989 Other specified abnormal findings of blood chemistry: Secondary | ICD-10-CM | POA: Diagnosis not present

## 2024-02-08 DIAGNOSIS — E7800 Pure hypercholesterolemia, unspecified: Secondary | ICD-10-CM

## 2024-02-08 DIAGNOSIS — M8588 Other specified disorders of bone density and structure, other site: Secondary | ICD-10-CM | POA: Diagnosis not present

## 2024-02-08 DIAGNOSIS — I700 Atherosclerosis of aorta: Secondary | ICD-10-CM

## 2024-02-08 MED ORDER — ROSUVASTATIN CALCIUM 10 MG PO TABS
10.0000 mg | ORAL_TABLET | Freq: Every day | ORAL | 1 refills | Status: AC
  Filled 2024-02-08: qty 90, 90d supply, fill #0

## 2024-05-08 ENCOUNTER — Other Ambulatory Visit

## 2024-11-21 ENCOUNTER — Encounter: Payer: Self-pay | Admitting: Family Medicine
